# Patient Record
Sex: Male | Born: 1965 | Race: White | Hispanic: No | Marital: Married | Smoking: Never smoker
Health system: Southern US, Community
[De-identification: ages and names within clinical notes are randomized; demographics above are authoritative.]

## PROBLEM LIST (undated history)

## (undated) DIAGNOSIS — E669 Obesity, unspecified: Secondary | ICD-10-CM

## (undated) DIAGNOSIS — Z8639 Personal history of other endocrine, nutritional and metabolic disease: Secondary | ICD-10-CM

## (undated) DIAGNOSIS — S46219A Strain of muscle, fascia and tendon of other parts of biceps, unspecified arm, initial encounter: Secondary | ICD-10-CM

## (undated) DIAGNOSIS — Z8719 Personal history of other diseases of the digestive system: Secondary | ICD-10-CM

## (undated) DIAGNOSIS — Z8679 Personal history of other diseases of the circulatory system: Secondary | ICD-10-CM

## (undated) DIAGNOSIS — R748 Abnormal levels of other serum enzymes: Secondary | ICD-10-CM

## (undated) HISTORY — DX: Personal history of other endocrine, nutritional and metabolic disease: Z86.39

## (undated) HISTORY — DX: Obesity, unspecified: E66.9

## (undated) HISTORY — DX: Abnormal levels of other serum enzymes: R74.8

## (undated) HISTORY — DX: Personal history of other diseases of the digestive system: Z87.19

## (undated) HISTORY — DX: Personal history of other diseases of the circulatory system: Z86.79

## (undated) HISTORY — DX: Strain of muscle, fascia and tendon of other parts of biceps, unspecified arm, initial encounter: S46.219A

## (undated) HISTORY — PX: APPENDECTOMY: SHX54

## (undated) HISTORY — PX: BICEPS TENDON REPAIR: SHX566

---

## 2003-08-25 ENCOUNTER — Ambulatory Visit (HOSPITAL_BASED_OUTPATIENT_CLINIC_OR_DEPARTMENT_OTHER): Admission: RE | Admit: 2003-08-25 | Discharge: 2003-08-25 | Payer: Self-pay | Admitting: *Deleted

## 2004-02-18 ENCOUNTER — Ambulatory Visit (HOSPITAL_COMMUNITY): Admission: RE | Admit: 2004-02-18 | Discharge: 2004-02-18 | Payer: Self-pay | Admitting: *Deleted

## 2004-10-01 ENCOUNTER — Ambulatory Visit: Payer: Self-pay | Admitting: Family Medicine

## 2004-12-31 ENCOUNTER — Ambulatory Visit: Payer: Self-pay | Admitting: Family Medicine

## 2006-08-16 ENCOUNTER — Ambulatory Visit: Payer: Self-pay | Admitting: Family Medicine

## 2006-08-28 ENCOUNTER — Ambulatory Visit: Payer: Self-pay | Admitting: Internal Medicine

## 2006-10-03 ENCOUNTER — Ambulatory Visit: Payer: Self-pay | Admitting: Family Medicine

## 2006-10-03 LAB — CONVERTED CEMR LAB
ALT: 65 units/L — ABNORMAL HIGH (ref 0–40)
AST: 37 units/L (ref 0–37)
BUN: 10 mg/dL (ref 6–23)
Basophils Relative: 0.2 % (ref 0.0–1.0)
CO2: 32 meq/L (ref 19–32)
Calcium: 9.1 mg/dL (ref 8.4–10.5)
Chloride: 106 meq/L (ref 96–112)
Creatinine, Ser: 1.1 mg/dL (ref 0.4–1.5)
Eosinophils Absolute: 0.2 10*3/uL (ref 0.0–0.6)
Eosinophils Relative: 2.4 % (ref 0.0–5.0)
HDL: 40.2 mg/dL (ref >39.0)
Platelets: 205 10*3/uL (ref 150–400)
Potassium: 3.9 meq/L (ref 3.5–5.1)
RBC: 5.2 M/uL (ref 4.22–5.81)
RDW: 13.3 % (ref 11.5–14.6)
TSH: 2.27 microintl units/mL (ref 0.35–5.50)
Total CHOL/HDL Ratio: 5.1
Triglycerides: 96 mg/dL (ref 0–149)
VLDL: 19 mg/dL (ref 0–40)
WBC: 8.7 10*3/uL (ref 4.5–10.5)

## 2006-10-26 ENCOUNTER — Ambulatory Visit: Payer: Self-pay | Admitting: Family Medicine

## 2008-08-25 ENCOUNTER — Ambulatory Visit: Payer: Self-pay | Admitting: Family Medicine

## 2008-10-02 ENCOUNTER — Encounter (INDEPENDENT_AMBULATORY_CARE_PROVIDER_SITE_OTHER): Payer: Self-pay | Admitting: *Deleted

## 2008-10-02 DIAGNOSIS — E785 Hyperlipidemia, unspecified: Secondary | ICD-10-CM | POA: Insufficient documentation

## 2008-10-02 LAB — CONVERTED CEMR LAB: HDL: 40.2 mg/dL

## 2008-10-03 ENCOUNTER — Ambulatory Visit: Payer: Self-pay | Admitting: Family Medicine

## 2008-10-03 LAB — CONVERTED CEMR LAB
Glucose, Urine, Semiquant: NEGATIVE
Protein, U semiquant: NEGATIVE
Specific Gravity, Urine: 1.01
WBC Urine, dipstick: NEGATIVE
pH: 5.5

## 2008-10-06 LAB — CONVERTED CEMR LAB
ALT: 52 units/L (ref 0–53)
Albumin: 4.2 g/dL (ref 3.5–5.2)
Basophils Absolute: 0 10*3/uL (ref 0.0–0.1)
Basophils Relative: 0.2 % (ref 0.0–3.0)
Calcium: 9.1 mg/dL (ref 8.4–10.5)
Cholesterol: 192 mg/dL (ref 0–200)
Creatinine, Ser: 1.1 mg/dL (ref 0.4–1.5)
Eosinophils Absolute: 0.1 10*3/uL (ref 0.0–0.7)
GFR calc non Af Amer: 79 mL/min
HCT: 43.4 % (ref 39.0–52.0)
Hemoglobin: 15.6 g/dL (ref 13.0–17.0)
MCHC: 36 g/dL (ref 30.0–36.0)
MCV: 86.3 fL (ref 78.0–100.0)
Monocytes Absolute: 0.7 10*3/uL (ref 0.1–1.0)
Neutro Abs: 5.7 10*3/uL (ref 1.4–7.7)
PSA: 0.85 ng/mL (ref 0.10–4.00)
RBC: 5.03 M/uL (ref 4.22–5.81)
TSH: 1.93 microintl units/mL (ref 0.35–5.50)
Total Bilirubin: 0.8 mg/dL (ref 0.3–1.2)
VLDL: 22 mg/dL (ref 0–40)

## 2008-12-02 ENCOUNTER — Ambulatory Visit: Payer: Self-pay | Admitting: Family Medicine

## 2009-10-09 ENCOUNTER — Ambulatory Visit: Payer: Self-pay | Admitting: Family Medicine

## 2009-10-09 DIAGNOSIS — I10 Essential (primary) hypertension: Secondary | ICD-10-CM

## 2009-10-12 LAB — CONVERTED CEMR LAB
ALT: 70 units/L — ABNORMAL HIGH (ref 0–53)
Albumin: 4.3 g/dL (ref 3.5–5.2)
Basophils Relative: 0.1 % (ref 0.0–3.0)
Bilirubin, Direct: 0.2 mg/dL (ref 0.0–0.3)
CO2: 32 meq/L (ref 19–32)
Calcium: 8.8 mg/dL (ref 8.4–10.5)
Direct LDL: 161.6 mg/dL
Eosinophils Absolute: 0.1 10*3/uL (ref 0.0–0.7)
Eosinophils Relative: 1.7 % (ref 0.0–5.0)
Glucose, Bld: 101 mg/dL — ABNORMAL HIGH (ref 70–99)
HCT: 47.8 % (ref 39.0–52.0)
HDL: 41.8 mg/dL (ref >39.00)
Lymphs Abs: 1.6 10*3/uL (ref 0.7–4.0)
MCHC: 33.1 g/dL (ref 30.0–36.0)
MCV: 90.6 fL (ref 78.0–100.0)
Monocytes Absolute: 0.5 10*3/uL (ref 0.1–1.0)
Neutro Abs: 3.8 10*3/uL (ref 1.4–7.7)
Neutrophils Relative %: 62.8 % (ref 43.0–77.0)
Potassium: 3.9 meq/L (ref 3.5–5.1)
RBC: 5.28 M/uL (ref 4.22–5.81)
TSH: 1.91 microintl units/mL (ref 0.35–5.50)
Total Protein: 7.1 g/dL (ref 6.0–8.3)
Triglycerides: 57 mg/dL (ref 0.0–149.0)

## 2009-10-21 ENCOUNTER — Encounter: Payer: Self-pay | Admitting: Family Medicine

## 2009-11-10 ENCOUNTER — Ambulatory Visit: Payer: Self-pay | Admitting: Family Medicine

## 2009-11-10 DIAGNOSIS — R740 Nonspecific elevation of levels of transaminase and lactic acid dehydrogenase [LDH]: Secondary | ICD-10-CM

## 2010-02-09 ENCOUNTER — Ambulatory Visit: Payer: Self-pay | Admitting: Family Medicine

## 2010-02-09 LAB — CONVERTED CEMR LAB
ALT: 53 units/L (ref 0–53)
AST: 32 units/L (ref 0–37)
Albumin: 4 g/dL (ref 3.5–5.2)
Alkaline Phosphatase: 37 units/L — ABNORMAL LOW (ref 39–117)
Bilirubin, Direct: 0.1 mg/dL (ref 0.0–0.3)
Cholesterol: 195 mg/dL (ref 0–200)
Total Protein: 6.3 g/dL (ref 6.0–8.3)
Triglycerides: 102 mg/dL (ref 0.0–149.0)

## 2010-02-17 ENCOUNTER — Ambulatory Visit: Payer: Self-pay | Admitting: Family Medicine

## 2010-10-04 ENCOUNTER — Telehealth (INDEPENDENT_AMBULATORY_CARE_PROVIDER_SITE_OTHER): Payer: Self-pay | Admitting: *Deleted

## 2010-10-07 ENCOUNTER — Other Ambulatory Visit (INDEPENDENT_AMBULATORY_CARE_PROVIDER_SITE_OTHER): Payer: BC Managed Care – PPO

## 2010-10-07 ENCOUNTER — Encounter (INDEPENDENT_AMBULATORY_CARE_PROVIDER_SITE_OTHER): Payer: Self-pay | Admitting: *Deleted

## 2010-10-07 ENCOUNTER — Ambulatory Visit: Admit: 2010-10-07 | Payer: Self-pay | Admitting: Family Medicine

## 2010-10-07 ENCOUNTER — Other Ambulatory Visit: Payer: Self-pay | Admitting: Family Medicine

## 2010-10-07 DIAGNOSIS — I10 Essential (primary) hypertension: Secondary | ICD-10-CM

## 2010-10-07 DIAGNOSIS — E785 Hyperlipidemia, unspecified: Secondary | ICD-10-CM

## 2010-10-07 DIAGNOSIS — Z0000 Encounter for general adult medical examination without abnormal findings: Secondary | ICD-10-CM

## 2010-10-07 LAB — CBC WITH DIFFERENTIAL/PLATELET
Basophils Absolute: 0 10*3/uL (ref 0.0–0.1)
Basophils Relative: 0.2 % (ref 0.0–3.0)
HCT: 43.4 % (ref 39.0–52.0)
Hemoglobin: 15.1 g/dL (ref 13.0–17.0)
Lymphocytes Relative: 33.1 % (ref 12.0–46.0)
Lymphs Abs: 2.2 10*3/uL (ref 0.7–4.0)
MCHC: 34.7 g/dL (ref 30.0–36.0)
Monocytes Relative: 9.1 % (ref 3.0–12.0)
Neutro Abs: 3.7 10*3/uL (ref 1.4–7.7)
RBC: 4.81 Mil/uL (ref 4.22–5.81)
RDW: 13.6 % (ref 11.5–14.6)

## 2010-10-07 LAB — BASIC METABOLIC PANEL
CO2: 29 mEq/L (ref 19–32)
Calcium: 8.7 mg/dL (ref 8.4–10.5)
Glucose, Bld: 96 mg/dL (ref 70–99)
Potassium: 4.3 mEq/L (ref 3.5–5.1)
Sodium: 138 mEq/L (ref 135–145)

## 2010-10-07 LAB — HEPATIC FUNCTION PANEL
AST: 32 U/L (ref 0–37)
Albumin: 4 g/dL (ref 3.5–5.2)
Alkaline Phosphatase: 37 U/L — ABNORMAL LOW (ref 39–117)
Total Protein: 6.5 g/dL (ref 6.0–8.3)

## 2010-10-07 LAB — LIPID PANEL: Total CHOL/HDL Ratio: 5

## 2010-10-07 NOTE — Assessment & Plan Note (Signed)
Summary: 9M F/U DLO   Vital Signs:  Patient profile:   45 year old male Height:      68 inches Weight:      260.50 pounds BMI:     39.75 Temp:     97.3 degrees F oral Pulse rate:   84 / minute Pulse rhythm:   regular BP sitting:   126 / 80  (left arm) Cuff size:   large  Vitals Entered By: Linde Gillis CMA Duncan Dull) (February 17, 2010 7:58 AM) CC: 3 months follow up   History of Present Illness: here for f/u of HTN and chol   lipids imp with trig 102, HDL 38, LDL 136 (down from 161)  ast/alt are better  wt is down 4 lb  bp good 126/80  is doing well overall  has cut out some of the fatty foods -- has cut down on eats  is working hard outside -- and this is helping a lot too   arms and shoulders are giving him some problems - hurting  pain has slacked off a bit  hx of bicep rupture does not have the strength he used to     Allergies: No Known Drug Allergies  Past History:  Past Surgical History: Last updated: 10/02/2008 SX- ruptured biceps tendon  Family History: Last updated: 10/03/2008 father HTN, prostate cancer mother - mouth cancer  GM with CAD GF with CAD  Social History: Last updated: 10/03/2008 works in Orthoptist - / driving  married  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 Eyes:  Denies blurring, eye irritation, and eye pain. CV:  Denies chest pain or discomfort, palpitations, shortness of breath with exertion, and swelling of feet. Resp:  Denies cough, shortness of breath, and wheezing. GI:  Denies abdominal pain, change in bowel habits, and indigestion. GU:  Denies urinary frequency. MS:  Complains of joint pain and stiffness; denies joint redness, joint swelling, cramps, and muscle  weakness. Derm:  Denies poor wound healing and rash. Neuro:  Denies headaches, numbness, and tingling. Endo:  Denies excessive thirst and excessive urination.  Physical Exam  General:  overweight but generally well appearing  Head:  normocephalic, atraumatic, and no abnormalities observed.   Eyes:  vision grossly intact, pupils equal, pupils round, and pupils reactive to light.  no conjunctival pallor, injection or icterus  Mouth:  pharynx pink and moist.   Neck:  supple with full rom and no masses or thyromegally, no JVD or carotid bruit   Lungs:  Normal respiratory effort, chest expands symmetrically. Lungs are clear to auscultation, no crackles or wheezes. Heart:  Normal rate and regular rhythm. S1 and S2 normal without gallop, murmur, click, rub or other extra sounds. Msk:  No deformity or scoliosis noted of thoracic or lumbar spine.  no acute joint changes nl rom arms and shoulders today without bony tenderness  Pulses:  R and L carotid,radial,femoral,dorsalis pedis and posterior tibial pulses are full and equal bilaterally Extremities:  No clubbing, cyanosis, edema, or deformity noted with normal full range of motion of all joints.   Neurologic:  sensation intact to light touch, gait normal, and DTRs symmetrical and normal.   Skin:  Intact without suspicious lesions or rashes tanned  Cervical Nodes:  No lymphadenopathy noted  Psych:  normal affect, talkative and pleasant    Impression & Recommendations:  Problem # 1:  ESSENTIAL HYPERTENSION, BENIGN (ICD-401.1) Assessment Improved  this continues to improve with lisinopril and now wt loss no changes  urged to keep up the good work f/u 6 mo  His updated medication list for this problem includes:    Lisinopril 10 Mg Tabs (Lisinopril) .Marland Kitchen... 1 by mouth once daily  BP today: 126/80 Prior BP: 140/86 (11/10/2009)  Labs Reviewed: K+: 3.9 (10/09/2009) Creat: : 1.0 (10/09/2009)   Chol: 195 (02/09/2010)   HDL: 38.30 (02/09/2010)    LDL: 136 (02/09/2010)   TG: 102.0 (02/09/2010)  Problem # 2:  TRANSAMINASES, SERUM, ELEVATED (ICD-790.4) Assessment: Improved these are down to nl with wt loss and better diet  suspect fatty liver  urged to keep up better habits  re check 6 mo   Problem # 3:  HYPERLIPIDEMIA (ICD-272.4) Assessment: Improved  this is improved with smaller portions and more exercise disc lower sat fat diet in detail - still could do better with food choices would like to see LDL under 130- or better, under 100 plan to re check in 6 mo and f/u   Labs Reviewed: SGOT: 32 (02/09/2010)   SGPT: 53 (02/09/2010)   HDL:38.30 (02/09/2010), 41.80 (10/09/2009)  LDL:136 (02/09/2010), 135 (10/03/2008)  Chol:195 (02/09/2010), 210 (10/09/2009)  Trig:102.0 (02/09/2010), 57.0 (10/09/2009)  Problem # 4:  ARM PAIN (ICD-729.5) Assessment: New this is intermittent in pt with hx of bilat bicep ruptures in past  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 Take 1 tablet by mouth once a day 2)  Lisinopril 10 Mg Tabs (Lisinopril) .Marland Kitchen.. 1 by mouth once daily  Patient Instructions: 1)  keep working on smaller portions 2)  in addition - avoid as much saturated fat as possible (you can raise your HDL (good cholesterol) by increasing exercise and eating omega 3 fatty acid supplement like fish oil or flax seed oil over the counter 3)  you can lower LDL (bad cholesterol) by limiting saturated fats in diet like red meat, fried foods, egg yolks, fatty breakfast meats, high fat dairy products and shellfish )-- especially fried foods 4)  keep up exercise  5)  no change in medicine  6)  bp and cholesterol are better  7)  schedule fasting labs and then follow up in 6 months -- lipid/ast/alt/renal 272, 401.1  8)  if your shoulder and arm pain return- call us to make appt with Dr  Dallas Schimke ( sports med specialist)   Current Allergies (reviewed today): No known allergies

## 2010-10-07 NOTE — Assessment & Plan Note (Signed)
Summary: CPX FOR DOT PER LAURIE/DLO   Vital Signs:  Patient profile:   45 year old male Height:      68 inches Weight:      262 pounds BMI:     39.98 Temp:     98.4 degrees F oral Pulse rate:   72 / minute Pulse rhythm:   regular BP supine:   140 / 95  Vitals Entered By: Lowella Petties CMA (October 09, 2009 8:12 AM) CC: Check up with form  Vision Screening:Left eye w/o correction: 20 / 20 Right Eye w/o correction: 20 / 50 Both eyes w/o correction:  20/ 20        Vision Entered By: Lowella Petties CMA (October 09, 2009 8:13 AM)  Hearing Screen  20db HL: Left  500 hz: 25db 1000 hz: 25db 2000 hz: 25db 4000 hz: 25db Right  500 hz: 25db 1000 hz: 25db 2000 hz: 25db 4000 hz: 25db   Hearing Testing Entered By: Lowella Petties CMA (October 09, 2009 8:13 AM)   History of Present Illness: here for DOT physical  bp- has been ok at home 120-130s / 80s   one day last week - bp was pretty high and he felt lousy  vision was blurry and very slt headache feels much better now   has tendancy to fall asleep in recliner at night  does snore but no fatigue - feels rested overall   lipids last check LDL in 130s  nl hearing   vison nl both eyes but 20/50 in the R eye  pt states he is always "weak "in his R eye - this is nl for him uses reading glasses     Allergies: No Known Drug Allergies  Past History:  Past Medical History: Last updated: 10/03/2008 GERD Hyperlipidemia obesity hx of bicep tendon rupture bilaterally  Past Surgical History: Last updated: 10/02/2008 SX- ruptured biceps tendon  Family History: Last updated: 10/03/2008 father HTN, prostate cancer mother - mouth cancer  GM with CAD GF with CAD  Social History: Last updated: 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 Eyes:  Denies blurring and eye pain. CV:  Denies chest pain or discomfort, lightheadness, and palpitations. Resp:  Denies cough and wheezing. GI:  Denies abdominal pain, bloody stools, change in bowel habits, indigestion, and nausea. GU:  Denies nocturia, urinary frequency, and urinary hesitancy. MS:  Denies joint pain, muscle weakness, and stiffness. Derm:  Denies itching, lesion(s), poor wound healing, and rash. Neuro:  Denies numbness and tingling. Psych:  Denies anxiety and depression. Endo:  Denies excessive thirst and excessive urination. Heme:  Denies abnormal bruising and bleeding.  Physical Exam  General:  overweight but generally well appearing  Head:  normocephalic, atraumatic, and no abnormalities observed.   Eyes:  vision grossly intact, pupils equal, pupils round, and pupils reactive to light.  no conjunctival pallor, injection or icterus  Ears:  R ear normal and L ear normal.  - scant cerumen Nose:  no nasal discharge.   Mouth:  pharynx pink and moist.   Neck:  supple with full rom and no masses or thyromegally, no JVD or carotid bruit   Chest Wall:  No deformities, masses, tenderness or gynecomastia noted. Lungs:  Normal respiratory effort, chest expands symmetrically. Lungs are clear  to auscultation, no crackles or wheezes. Heart:  Normal rate and regular rhythm. S1 and S2 normal without gallop, murmur, click, rub or other extra sounds. Abdomen:  Bowel sounds positive,abdomen soft and non-tender without masses, organomegaly or hernias noted. no renal bruits Rectal:  No external abnormalities noted. Normal sphincter tone. No rectal masses or tenderness. Prostate:  Prostate gland firm and smooth, no enlargement, nodularity, tenderness, mass, asymmetry or induration. Msk:  No deformity or scoliosis noted of thoracic or lumbar spine.  no acute joint changes  Pulses:  R and L carotid,radial,femoral,dorsalis pedis and posterior tibial pulses are full and equal  bilaterally Extremities:  No clubbing, cyanosis, edema, or deformity noted with normal full range of motion of all joints.   Neurologic:  sensation intact to light touch, gait normal, and DTRs symmetrical and normal.   Skin:  Intact without suspicious lesions or rashes ruddy complexion stable brown nevi on back and some lentigos Cervical Nodes:  No lymphadenopathy noted Inguinal Nodes:  No significant adenopathy Psych:  normal affect, talkative and pleasant    Impression & Recommendations:  Problem # 1:  OTH GENERAL MEDICAL EXAMINATION ADMIN PURPOSES (ICD-V70.3) Assessment Comment Only DOT- approved for 1 year in light of HTN  no restrictions  reviewed health habits including diet, exercise and skin cancer prevention reviewed health maintenance list and family history  Problem # 2:  ESSENTIAL HYPERTENSION, BENIGN (ICD-401.1) Assessment: Unchanged new dx  start lisinopril handout on lifestyle change aafp- disc this in detail- esp low sodium diet  His updated medication list for this problem includes:    Lisinopril 10 Mg Tabs (Lisinopril) .Marland Kitchen... 1 by mouth once daily  Orders: Venipuncture (78295) TLB-Lipid Panel (80061-LIPID) TLB-Renal Function Panel (80069-RENAL) TLB-CBC Platelet - w/Differential (85025-CBCD) TLB-Hepatic/Liver Function Pnl (80076-HEPATIC) TLB-TSH (Thyroid Stimulating Hormone) (84443-TSH)  Problem # 3:  HYPERLIPIDEMIA (ICD-272.4) Assessment: Unchanged  lab today fair diet  disc at f/u  Orders: Venipuncture (62130) TLB-Lipid Panel (80061-LIPID) TLB-Renal Function Panel (80069-RENAL) TLB-CBC Platelet - w/Differential (85025-CBCD) TLB-Hepatic/Liver Function Pnl (80076-HEPATIC) TLB-TSH (Thyroid Stimulating Hormone) (84443-TSH)  Labs Reviewed: SGOT: 34 (10/03/2008)   SGPT: 52 (10/03/2008)   HDL:35.6 (10/03/2008), 40.2 (10/02/2008)  LDL:135 (10/03/2008), 148 (10/02/2008)  Chol:192 (10/03/2008), 204 (10/02/2008)  Trig:109 (10/03/2008), 96  (10/03/2006)  Complete Medication List: 1)  Prilosec 20 Mg Cpdr (Omeprazole) .... Take 1 tablet by mouth once a day 2)  Lisinopril 10 Mg Tabs (Lisinopril) .Marland Kitchen.. 1 by mouth once daily  Other Orders: Ophthalmology Referral (Ophthalmology)  Patient Instructions: 1)  start lisinopril one pill daily  2)  work on low salt diet  3)  if any side effects please update me  4)  we will do eye doctor referral at check out  5)  follow up with me in about 1 month  Prescriptions: LISINOPRIL 10 MG TABS (LISINOPRIL) 1 by mouth once daily  #30 x 11   Entered and Authorized by:   Judith Part MD   Signed by:   Judith Part MD on 10/09/2009   Method used:   Electronically to        [facility]  [place] (retail)       [address]       [place], [place]  [address]       Ph: [phone] or [phone]       Fax: [phone]   RxID:   [id number]

## 2010-10-07 NOTE — Consult Note (Signed)
Summary: [facility]   Imported By: Lanelle Bal 11/02/2009 13:45:01  _____________________________________________________________________  External Attachment:    Type:   Image     Comment:   External Document

## 2010-10-07 NOTE — Letter (Signed)
Summary: CDL Form  CDL Form   Imported By: Beau Fanny 10/09/2009 10:59:00  _____________________________________________________________________  External Attachment:    Type:   Image     Comment:   External Document

## 2010-10-07 NOTE — Assessment & Plan Note (Signed)
Summary: 3 M F/U DLO   Vital Signs:  Patient profile:   45 year old male Height:      68 inches Weight:      264.75 pounds BMI:     40.40 Temp:     98.6 degrees F oral Pulse rate:   72 / minute Pulse rhythm:   regular BP sitting:   140 / 86  (left arm) Cuff size:   large  Vitals Entered By: Lewanda Rife LPN (November 10, 9145 8:04 AM)  Serial Vital Signs/Assessments:  Time      Position  BP       Pulse  Resp  Temp     By                     125/85                         Judith Part MD   History of Present Illness: here for f/u of HTN and lipids and elevated ast/alt   wt is up 2 lb  bp first check 140/86 which is improved is tolerating his bp med fine without problems  1-2 days later 120s/70s  yest am checked it - back up a little  no dizziness or low bp   lipids were high at check 1 mo ago with trig 57/ HDL 41 and LDL 161 (up from 130s) has tried to cut back a bit  eats steak 2-3 times per week  some eggs and egg salad occas- not often  some fried foods pretty often   hard to find other foods to eat  does not want more med   ast /alt 41 and 70 - tend to go up and down      Allergies (verified): No Known Drug Allergies  Past History:  Past Surgical History: Last updated: 10/02/2008 SX- ruptured biceps tendon  Family History: Last updated: 10/03/2008 father HTN, prostate cancer mother - mouth cancer  GM with CAD GF with CAD  Social History: 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 Eyes:  Denies blurring and eye irritation. CV:  Denies chest pain or discomfort, lightheadness, and palpitations. Resp:  Denies cough, shortness of breath, and  wheezing. GI:  Denies change in bowel habits and nausea. GU:  Denies urinary frequency. MS:  Denies joint pain, joint redness, joint swelling, and muscle aches. Derm:  Denies itching, lesion(s), poor wound healing, and rash. Neuro:  Denies numbness and tingling. Psych:  Denies anxiety and depression. Endo:  Denies cold intolerance, excessive thirst, excessive urination, and heat intolerance. Heme:  Denies abnormal bruising and bleeding.  Physical Exam  General:  overweight but generally well appearing  Head:  normocephalic, atraumatic, and no abnormalities observed.   Mouth:  pharynx pink and moist.   Neck:  supple with full rom and no masses or thyromegally, no JVD or carotid bruit   Lungs:  Normal respiratory effort, chest expands symmetrically. Lungs are clear to auscultation, no crackles or wheezes. Heart:  Normal rate and regular rhythm. S1 and S2 normal without gallop, murmur, click, rub or other extra sounds. Abdomen:  Bowel sounds positive,abdomen soft  and non-tender without masses, organomegaly or hernias noted. no renal bruits Msk:  No deformity or scoliosis noted of thoracic or lumbar spine.  no acute joint changes  Extremities:  No clubbing, cyanosis, edema, or deformity noted with normal full range of motion of all joints.   Neurologic:  sensation intact to light touch, gait normal, and DTRs symmetrical and normal.   Skin:  Intact without suspicious lesions or rashes Cervical Nodes:  No lymphadenopathy noted Psych:  normal affect, talkative and pleasant    Impression & Recommendations:  Problem # 1:  ESSENTIAL HYPERTENSION, BENIGN (ICD-401.1) Assessment Improved  improved with lisinopril disc healthy diet (low simple sugar/ choose complex carbs/ low sat fat) diet and exercise in detail  did adv to continue monitoring at home  will be more active this spring work on wt loss lab and f/u in 3 mo  His updated medication list for this problem includes:    Lisinopril  10 Mg Tabs (Lisinopril) .Marland Kitchen... 1 by mouth once daily  BP today: 140/86-- re check 122/ 85 at rest with large cuff Prior BP: 140/95 (10/09/2009)  Labs Reviewed: K+: 3.9 (10/09/2009) Creat: : 1.0 (10/09/2009)   Chol: 210 (10/09/2009)   HDL: 41.80 (10/09/2009)   LDL: 135 (10/03/2008)   TG: 57.0 (10/09/2009)  Problem # 2:  HYPERLIPIDEMIA (ICD-272.4) Assessment: Deteriorated  disc this in detail- pt eats very high sat fat diet this has been lower in past with better diet  rev sat fat in detail - pt has handout  rev labs with pt in detail today and goals for LDL and HDL  sched fasting lab and f/u in 79mo   Labs Reviewed: SGOT: 41 (10/09/2009)   SGPT: 70 (10/09/2009)   HDL:41.80 (10/09/2009), 35.6 (10/03/2008)  LDL:135 (10/03/2008), 148 (10/02/2008)  Chol:210 (10/09/2009), 192 (10/03/2008)  Trig:57.0 (10/09/2009), 109 (10/03/2008)  Problem # 3:  TRANSAMINASES, SERUM, ELEVATED (ICD-790.4) Assessment: Deteriorated this is intermittent and likely due to fatty liver/ poor diet  disc this in detail no hx of hepatitis or liver dz and no symptoms  will re check with wt loss and better diet and f/u 3 mo  Problem # 4:  TRANSAMINASES, SERUM, ELEVATED (ICD-790.4)  Complete Medication List: 1)  Prilosec 20 Mg Cpdr (Omeprazole) .... Take 1 tablet by mouth once a day 2)  Lisinopril 10 Mg Tabs (Lisinopril) .Marland Kitchen.. 1 by mouth once daily  Patient Instructions: 1)  you can raise your HDL (good cholesterol) by increasing exercise and eating omega 3 fatty acid supplement like fish oil or flax seed oil over the counter 2)  you can lower LDL (bad cholesterol) by limiting saturated fats in diet like red meat, fried foods, egg yolks, fatty breakfast meats, high fat dairy products and shellfish  3)  schedule fasting labs in 3 months and then follow up  4)  lipid/hepatic fxn for 272 and 790.4  Current Allergies (reviewed today): No known allergies

## 2010-10-13 ENCOUNTER — Encounter: Payer: Self-pay | Admitting: Family Medicine

## 2010-10-13 ENCOUNTER — Encounter (INDEPENDENT_AMBULATORY_CARE_PROVIDER_SITE_OTHER): Payer: BC Managed Care – PPO | Admitting: Family Medicine

## 2010-10-13 DIAGNOSIS — D485 Neoplasm of uncertain behavior of skin: Secondary | ICD-10-CM

## 2010-10-13 DIAGNOSIS — E785 Hyperlipidemia, unspecified: Secondary | ICD-10-CM

## 2010-10-13 DIAGNOSIS — Z0289 Encounter for other administrative examinations: Secondary | ICD-10-CM

## 2010-10-13 DIAGNOSIS — Z0000 Encounter for general adult medical examination without abnormal findings: Secondary | ICD-10-CM

## 2010-10-13 DIAGNOSIS — I10 Essential (primary) hypertension: Secondary | ICD-10-CM

## 2010-10-13 DIAGNOSIS — Z8042 Family history of malignant neoplasm of prostate: Secondary | ICD-10-CM

## 2010-10-13 LAB — CONVERTED CEMR LAB
Bacteria, UA: 0
Bilirubin Urine: NEGATIVE
Glucose, Urine, Semiquant: NEGATIVE
Protein, U semiquant: NEGATIVE
Specific Gravity, Urine: 1.01
WBC, UA: 0 cells/hpf
Yeast, UA: 0
pH: 8

## 2010-10-13 NOTE — Progress Notes (Signed)
----   Converted from flag ---- ---- 10/02/2010 2:44 PM, Colon Flattery Tower MD wrote: please check wellness/ lipid for v70.0 and 272 and 401.1 thanks  ---- 10/01/2010 10:03 AM, Liane Comber CMA (AAMA) wrote: Lab orders please! Good Morning! This pt is scheduled for cpx labs [place], which labs to draw and dx codes to use? Thanks Tasha ------------------------------

## 2010-10-27 NOTE — Assessment & Plan Note (Signed)
Summary: CPX/DLO   Vital Signs:  Patient profile:   45 year old male Height:      67.5 inches Weight:      263.50 pounds BMI:     40.81 Temp:     98.2 degrees F oral Pulse rate:   80 / minute Pulse rhythm:   regular BP sitting:   126 / 78  (left arm) Cuff size:   large  Vitals Entered By: Lewanda Rife LPN (October 13, 2010 9:47 AM) CC: CPX and DOT exam  Vision Screening:Left eye w/o correction: 20 / 20 Right Eye w/o correction: 20 / 40 Both eyes w/o correction:  20/ 20        Vision Entered By: Lewanda Rife LPN (October 13, 2010 9:48 AM)  Hearing Screen 25db HL: Left  500 hz: 25db 1000 hz: 25db 2000 hz: 25db 4000 hz: 25db Right  500 hz: 25db 1000 hz: 25db 2000 hz: 25db 4000 hz: 25db    History of Present Illness: here for DOT examination and to review chronic med problems  is doing ok - overall nothing new  no injuries or bicep problems  had good holidays   wt is stable with bmi of 40 (though is also muscular build)  bp is well controlled 126/78- doing well with that on lisinoprol  prilosec for GERD works well   chol up a little with LDL 149 from 136 diet - not much of an effort -- but tries not to eat as much fried food  chol tends to go down with working in summer   TD 07 does not get flu shots   father hx prostate cancer his psa nl in 2010 baseline  no urinary problems at all , no nocturia       Allergies (verified): No Known Drug Allergies  Past History:  Past Medical History: Last updated: 02/17/2010 GERD Hyperlipidemia obesity hx of bicep tendon rupture bilaterally HTN elevated transaminases (likely fatty liver)  Past Surgical History: Last updated: 10/02/2008 SX- ruptured biceps tendon  Family History: Last updated: 10/03/2008 father HTN, prostate cancer mother - mouth cancer  GM with CAD GF with CAD  Social History: Last updated: 10/03/2008 works in Orthoptist - / driving  married  has children  non smoker  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 Eyes:  Denies blurring and eye irritation. CV:  Denies chest pain or discomfort, lightheadness, palpitations, and shortness of breath with exertion. Resp:  Denies cough, shortness of breath, and wheezing. GI:  Denies abdominal pain, bloody stools, change in bowel habits, indigestion, and nausea. GU:  Denies dysuria, nocturia, urinary frequency, and urinary hesitancy. MS:  Denies joint pain, joint redness, and joint swelling. Derm:  Denies itching, lesion(s), poor wound healing, and rash. Neuro:  Denies headaches, numbness, and tingling. Psych:  Denies anxiety and depression. Endo:  Denies excessive thirst and excessive urination. Heme:  Denies abnormal bruising and bleeding.  Physical Exam  General:  overweight but generally well appearing  Head:  normocephalic, atraumatic, and no abnormalities observed.   Eyes:  vision grossly intact, pupils equal, pupils round, and pupils reactive to light.  no conjunctival pallor, injection or icterus  Ears:  R ear normal and L ear normal.  - scant cerumen Nose:  no nasal discharge.   Mouth:  pharynx pink and moist.   Neck:  supple with full rom and  no masses or thyromegally, no JVD or carotid bruit   Chest Wall:  No deformities, masses, tenderness or gynecomastia noted. Lungs:  Normal respiratory effort, chest expands symmetrically. Lungs are clear to auscultation, no crackles or wheezes. Heart:  Normal rate and regular rhythm. S1 and S2 normal without gallop, murmur, click, rub or other extra sounds. Abdomen:  Bowel sounds positive,abdomen soft and non-tender without masses, organomegaly or hernias noted. no renal bruits Rectal:  No external abnormalities noted. Normal sphincter tone. No rectal masses or tenderness. Prostate:  Prostate gland firm and smooth, no enlargement, nodularity, tenderness, mass, asymmetry  or induration. Msk:  No deformity or scoliosis noted of thoracic or lumbar spine.  no acute joint changes nl rom arms and shoulders today without bony tenderness  Pulses:  R and L carotid,radial,femoral,dorsalis pedis and posterior tibial pulses are full and equal bilaterally Extremities:  No clubbing, cyanosis, edema, or deformity noted with normal full range of motion of all joints.   Neurologic:  sensation intact to light touch, gait normal, and DTRs symmetrical and normal.   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 1 by mouth once daily  BP today: 126/78 Prior BP: 126/80 (02/17/2010)  Labs Reviewed: K+: 4.3 (10/07/2010) Creat: : 1.0 (10/07/2010)   Chol: 200 (10/07/2010)   HDL: 38.30 (10/07/2010)   LDL: 149 (10/07/2010)   TG: 64.0 (10/07/2010)  Orders: UA Dipstick W/ Micro (manual) (65784) Vision Screening (69629)  Problem # 4:  HYPERLIPIDEMIA (ICD-272.4) Assessment: Deteriorated  this is up  long disc re: low sat fat diet- pt  willing to make changes to avoid med re check 6 mo   Labs Reviewed: SGOT: 32 (10/07/2010)   SGPT: 57 (10/07/2010)   HDL:38.30 (10/07/2010), 38.30 (02/09/2010)  LDL:149 (10/07/2010), 136 (02/09/2010)  Chol:200 (10/07/2010), 195 (02/09/2010)  Trig:64.0 (10/07/2010), 102.0 (02/09/2010)  Problem # 5:  TRANSAMINASES, SERUM, ELEVATED (ICD-790.4) Assessment: Unchanged mild and overall stable suspect fatty liver- wt loss would help  Problem # 6:  NEOPLASM, MALIGNANT, PROSTATE, FAMILY HX (ICD-V16.42) Assessment: Unchanged nl exam - no symptoms  continue yearly DREs  Complete Medication List: 1)  Prilosec 20 Mg Cpdr (Omeprazole) .... Take 1 tablet by mouth once a day 2)  Lisinopril 10 Mg Tabs (Lisinopril) .Marland Kitchen.. 1 by mouth once daily 3)  Ibuprofen 200 Mg Tabs (Ibuprofen) .... Otc as directed.  Other Orders: Audiometry [phone])  Patient Instructions: 1)  you can raise your HDL (good cholesterol) by increasing exercise and eating omega 3 fatty acid supplement like fish oil or flax seed oil over the counter 2)  you can lower LDL (bad  cholesterol) by limiting saturated fats in diet like red meat, fried foods, egg yolks, fatty breakfast meats, high fat dairy products and shellfish  3)  we will do referral to dermatology at check out  4)  schedule fasting labs in 6 months for lipid/ast/alt 272 for high cholesterol  Prescriptions: LISINOPRIL 10 MG TABS (LISINOPRIL) 1 by mouth once daily  #30 x 11   Entered and Authorized by:   Judith Part MD   Signed by:   Judith Part MD on 10/13/2010   Method used:   Electronically to        [facility] [id number] (retail[facility]       [address]       [place], [place]  [address]       Ph: [phone] or [phone]       Fax: [phone]   RxID:   [id number]    Orders Added: 1)  UA Dipstick W/ Micro (manual) [81000] 2)  Audiometry [92552] 3)  Vision Screening [36644] 4)  Dermatology Referral [Derma] 5)  Est. Patient 40-64 years  [99396] 6)  Est. Patient Level II [03474]    Current Allergies (reviewed today): No known allergies   Laboratory Results   Urine Tests  Date/Time Received: October 13, 2010 9:49 AM  Date/Time Reported: October 13, 2010 9:49 AM   Routine Urinalysis   Color: yellow Appearance: Clear Glucose: negative   (Normal Range: Negative) Bilirubin: negative   (Normal Range: Negative) Ketone: negative   (Normal Range: Negative) Spec. Gravity: 1.010   (Normal Range: 1.003-1.035) Blood: negative   (Normal Range: Negative) pH: 8.0   (Normal Range: 5.0-8.0) Protein: negative   (Normal Range: Negative) Urobilinogen: 0.2   (Normal Range: 0-1) Nitrite: negative   (Normal Range: Negative) Leukocyte Esterace: negative   (Normal Range: Negative)  Urine Microscopic WBC/HPF: 0 RBC/HPF: 0 Bacteria/HPF: 0 Mucous/HPF: 0 Epithelial/HPF: 0-2 Crystals/HPF: 0 Casts/LPF: 0 Yeast/HPF: 0 Other: 0

## 2010-10-27 NOTE — Letter (Signed)
Summary: CDL Form  CDL Form   Imported By: Lanelle Bal 10/21/2010 10:40:14  _____________________________________________________________________  External Attachment:    Type:   Image     Comment:   External Document

## 2010-12-05 ENCOUNTER — Other Ambulatory Visit: Payer: Self-pay | Admitting: Family Medicine

## 2010-12-06 ENCOUNTER — Encounter: Payer: Self-pay | Admitting: Family Medicine

## 2010-12-24 ENCOUNTER — Encounter: Payer: Self-pay | Admitting: Family Medicine

## 2010-12-24 ENCOUNTER — Ambulatory Visit (INDEPENDENT_AMBULATORY_CARE_PROVIDER_SITE_OTHER): Payer: BC Managed Care – PPO | Admitting: Family Medicine

## 2010-12-24 VITALS — BP 122/80 | HR 76 | Temp 98.0°F | Ht 68.0 in | Wt 265.0 lb

## 2010-12-24 DIAGNOSIS — S0191XA Laceration without foreign body of unspecified part of head, initial encounter: Secondary | ICD-10-CM

## 2010-12-24 DIAGNOSIS — S0190XA Unspecified open wound of unspecified part of head, initial encounter: Secondary | ICD-10-CM

## 2010-12-24 NOTE — Patient Instructions (Signed)
Tylenol is ok for pain as needed  You can use a clean cold compress as needed If wound looks more red/ swollen or any pus- alert me Let the steri strips wear off by themselves  Antibiotic ointment like triple antibiotic is ok to prevent infection  If wound opens up or bleeds a lot - call  If increasing headache/ dizziness/ nausea/ vision change or personality change - call asap

## 2010-12-24 NOTE — Assessment & Plan Note (Signed)
superfical laceration of head after blunt trauma  Bleeding stopped and wound edges clean and well approx  Steri strips applied- 6 and pt tol well Wound care discussed  Adv to update if signs of infx or any symptoms of closed head inj that we rev in detail

## 2010-12-24 NOTE — Progress Notes (Signed)
  Subjective:    Patient ID: Clinton Fowler, male    DOB: 11-18-1965, 45 y.o.   MRN: [id number]  HPI Today was under a counter -- and came up and banged top of his head at 11:30 am Bled like crazy -- took 30 min to get it stopped  No dizziness or headache -- head is sore  Vision is fine   Cleaned the wound with hydrogen peroxide  No abx ointment Bleeding is stopped now  Wound is not gaping open  Prefers no stitches  Feels fine Bleeding is stopped   Td is up to date 2010      Review of Systems Review of Systems  Constitutional: Negative for fever, appetite change, fatigue and unexpected weight change.  Eyes: Negative for pain and visual disturbance.  Respiratory: Negative for cough and shortness of breath.   Cardiovascular: Negative.   Gastrointestinal: Negative for nausea, diarrhea and constipation.  Genitourinary: Negative for urgency and frequency.  Skin: Negative for pallor.  Neurological: Negative for weakness, light-headedness, numbness and headaches.  No dizziness  Hematological: Negative for adenopathy. Does not bruise/bleed easily.  Psychiatric/Behavioral: Negative for dysphoric mood. The patient is not nervous/anxious.         Objective:   Physical Exam  Constitutional: He is oriented to person, place, and time. He appears well-developed and well-nourished.       overwt and well appearing   HENT:  Head: Normocephalic.  Eyes: Conjunctivae and EOM are normal. Pupils are equal, round, and reactive to light.  Neck: Normal range of motion. Neck supple.  Neurological: He is alert and oriented to person, place, and time. He has normal reflexes. No cranial nerve deficit.  Skin: Skin is warm and dry. No pallor.       2-3 cm simple laceration on top of head to R (little hair in area) Wound edges well approx  slt ooze at top of wound where there is mild abrasion  Area cleaned in sterile fashion and 6 steri strips applied with benzosyn tincture  Pt tolerated well     Psychiatric: He has a normal mood and affect.          Assessment & Plan:

## 2010-12-27 ENCOUNTER — Encounter: Payer: Self-pay | Admitting: Family Medicine

## 2011-01-21 NOTE — Op Note (Signed)
NAME:  FELTON, BUCZYNSKI                         ACCOUNT NO.:  [id number]   MEDICAL RECORD NO.:  [id number]                   PATIENT TYPE:  AMB   LOCATION:  DSC                                  FACILITY:  [facility]   PHYSICIAN:  Lowell Bouton, M.D.      DATE OF BIRTH:  01/03/1966   DATE OF PROCEDURE:  08/25/2003  DATE OF DISCHARGE:                                 OPERATIVE REPORT   PREOPERATIVE DIAGNOSIS:  Ruptured biceps tendon, right forearm.   POSTOPERATIVE DIAGNOSIS:  Ruptured biceps tendon, right forearm.   PROCEDURE:  Repair of biceps tendon, right forearm.   SURGEON:  Lowell Bouton, M.D.   ANESTHESIA:  General.   FINDINGS:  The patient had about 5.0 cm of retraction of the tendon.  It was  fairly fibrotic beneath the lacertus fibrosis.   DESCRIPTION OF PROCEDURE:  Under general anesthesia, with the tourniquet  high on the right arm, the right arm was prepped and draped in the usual  fashion.  After exsanguinating the limb, the tourniquet was inflated to 250  mmHg.  An oblique incision was made just ulnar to the mobile wad volarly,  and extended up to the elbow crease.  It was then extended across the elbow  crease and proximally in a zigzag fashion across the elbow crease.  Sharp  dissection was carried through the subcutaneous tissues and bleeding points  were coagulated.  Blunt dissection was carried down through the subcutaneous  tissues, and the lateral cutaneous nerve of the forearm was identified.  It  was protected.  The dissection was then carried down to the cephalic vein  which the radial branch was tied off.  A #4-0 chromic suture was used to tie  the vein off.  The ulnar branch was retracted ulnarly.  The dissection was  then carried down between the biceps and the brachial radialis, and the  radial nerve was identified in that interval.  It was traced from proximal  to distal, taking care to protect the sensory portion and the deep portion.  Multiple crossing vessels were cauterized with bipolar cautery.  After  dissecting the nerve out, the nerve was gently retracted radially.  The  forearm was supinated and the biceps incertus fibrosis was opened.  The  tendon was identified and had retracted about 5.0 cm proximal to the  tuberosity.  The tendon was freed up bluntly and a #2 fiber wire was placed  in the end of the tendon after trimming the tendon, and down to about 8.0  mm.  The plan was to use an interference screw that was bio-absorbable, and  the tendon had to fit into the hole of the screw.  After preparing the  tendon end, the dissection was then carried down to the bicipital  tuberosity.  This was done with the forearm supinated, and one strand  of  the biceps tendon was still intact, so it was traced to the insertion.  The  area was then prepared and deep retractors were inserted, to see the area of  the bicipital tuberosity.  A guide wire was inserted, and then the large  drill for the interference screw was inserted over the guide wire.  Unfortunately it cut out ulnarly, and so when attempting to put in the  interference screw it did not hold.  At this point, the tourniquet had been  up two hours and so it was released.  The wound was irrigated with saline  and we prepared to use a suture anchor for the tendon.  A bio-absorbable #5  anchor was then placed just radial to the original hole in the tuberosity by  first drilling and then tapping the bio-absorbable anchor into the bone.  This was attached with #2 fiber wire, and so the fiber wire was then placed  through the tendon in a modified Kessler whip stitch fashion.  The wound was  then irrigated copiously, so any bone chips could be removed and suctioned  away.  The elbow was flexed, and the tourniquet was reinflated after 10  minutes.  The biceps tendon was then tied down to the anchor on the proximal  radius after preparing the area and roughening it up with a  drill.  The  fiber wire was cut and the second fiber wire that had been in the tendon was  left in the tendon.  The wound was then copiously irrigated, and was closed  over a Vesi-loop drain with #4-0 Vicryl in the subcutaneous tissues, and a  #4-0 running nylon in the skin.  Sterile dressings were applied, followed by  a posterior elbow splint with the elbow fully flexed.  Marcaine 0.5% was  inserted in the skin edges for pain control.  The tourniquet was released,  with good circulation of the hand.  The patient went to the recovery room awake, stable, and in good condition.                                               Lowell Bouton, M.D.    EMM/MEDQ  D:  08/25/2003  T:  08/26/2003  Job:  [id number]   cc:   Marne A. Milinda Antis, M.D. [facility]

## 2011-01-21 NOTE — Op Note (Signed)
NAME:  Clinton Fowler, Clinton Fowler                         ACCOUNT NO.:  [id number]   MEDICAL RECORD NO.:  [id number]                   PATIENT TYPE:  OIB   LOCATION:  2890                                 FACILITY:  [facility]   PHYSICIAN:  Lowell Bouton, M.D.      DATE OF BIRTH:  04-21-1966   DATE OF PROCEDURE:  02/18/2004  DATE OF DISCHARGE:                                 OPERATIVE REPORT   PREOPERATIVE DIAGNOSIS:  Ruptured distal biceps tendon, left forearm.   POSTOPERATIVE DIAGNOSIS:  Ruptured distal biceps tendon, left forearm.   OPERATION PERFORMED:  Repair of ruptured distal biceps tendon, left forearm.   SURGEON:  Lowell Bouton, M.D.   ANESTHESIA:  Axillary block.   OPERATIVE FINDINGS:  The patient had a complete rupture of the biceps tendon  from the radius.  The tendon had retracted about 7 cm proximally.   DESCRIPTION OF PROCEDURE:  Under axillary block anesthesia with a tourniquet  on the left arm, the left arm was prepped and draped in the usual sterile  fashion.  After exsanguinating the limb, the tourniquet was inflated to 250  mmHg.  An oblique incision was made in the forearm and then zigzagged across  the elbow.  Sharp dissection was carried through the subcutaneous tissues  and bleeding points were coagulated.  Blunt dissection was carried down to  the muscle fascia and the interval between the brachioradialis and the  brachialis was identified.  The lacertus fibrosus was identified and opened  and a ruptured tendon was found with fluid surrounding it.  The dissection  was then carried distally between the brachioradialis and the brachialis  down to the pronator teres.  Care was taken to protect the lateral  antebrachial cutaneous nerve and also the median nerve.  The dissection was  then carried down to the radius after identifying the radial nerve and  tracing it from proximal to distal.  The forearm was supinated to protect  the posterior interosseous  nerve and the area of attachment to the radius  was identified by tracing the lacertus down to the radius.  The multiple  veins were ligated and tied off with a 4-0 chromic suture.  The vessels over  the leash of Sherilyn Cooter were also tied off with a 4-0 chromic.  The forearm was  supinated and the area of the radial tuberosity was identified where the  tear had occurred.  This was freed up with a Glorious Peach and a drill bit was used  to drill a hole in the point of insertion for the biceps.  A biodegradable  anchor #5 was then inserted in the hole after tapping the hole and then #2  FiberWire times two was braided in a Kessler fashion through the stump of  the biceps.  The biceps was then cinched down onto the radius with the elbow  flexed.  The wound was then copiously irrigated and a vessel loop drain was  left in for  drainage.  Subcutaneous tissue was closed with a 4-0 Vicryl and  the skin with a 4-0 running nylon.  Sterile dressings were applied followed  by a posterior elbow splint.  The patient tolerated the procedure well and  went to the recovery room awake and stable in  good condition.                                               Lowell Bouton, M.D.    EMM/MEDQ  D:  02/18/2004  T:  02/18/2004  Job:  [id number]

## 2011-04-18 ENCOUNTER — Other Ambulatory Visit (INDEPENDENT_AMBULATORY_CARE_PROVIDER_SITE_OTHER): Payer: BC Managed Care – PPO | Admitting: Family Medicine

## 2011-04-18 DIAGNOSIS — E7800 Pure hypercholesterolemia, unspecified: Secondary | ICD-10-CM

## 2011-04-18 LAB — ALT: ALT: 47 U/L (ref 0–53)

## 2011-04-18 LAB — LIPID PANEL
Cholesterol: 188 mg/dL (ref 0–200)
HDL: 43.6 mg/dL (ref >39.00)
Triglycerides: 61 mg/dL (ref 0.0–149.0)
VLDL: 12.2 mg/dL (ref 0.0–40.0)

## 2011-11-05 ENCOUNTER — Other Ambulatory Visit: Payer: Self-pay | Admitting: Family Medicine

## 2011-11-07 NOTE — Telephone Encounter (Signed)
[facility] request refill Lisinopril 10 mg. Pt last seen 12/24/10 for laceration. Is it OK to refill?

## 2011-11-07 NOTE — Telephone Encounter (Signed)
Please sched appt spring or early summer- thanks Will refill electronically

## 2011-11-09 NOTE — Telephone Encounter (Signed)
Spoke with pt and scheduled a f/u early spring or summer.

## 2012-01-03 ENCOUNTER — Other Ambulatory Visit: Payer: Self-pay | Admitting: Family Medicine

## 2012-03-02 ENCOUNTER — Other Ambulatory Visit: Payer: Self-pay | Admitting: Family Medicine

## 2012-03-06 ENCOUNTER — Encounter: Payer: Self-pay | Admitting: Family Medicine

## 2012-03-06 ENCOUNTER — Ambulatory Visit (INDEPENDENT_AMBULATORY_CARE_PROVIDER_SITE_OTHER): Payer: BC Managed Care – PPO | Admitting: Family Medicine

## 2012-03-06 VITALS — BP 122/80 | HR 69 | Temp 98.0°F | Ht 68.0 in | Wt 272.0 lb

## 2012-03-06 DIAGNOSIS — E785 Hyperlipidemia, unspecified: Secondary | ICD-10-CM

## 2012-03-06 DIAGNOSIS — I10 Essential (primary) hypertension: Secondary | ICD-10-CM

## 2012-03-06 DIAGNOSIS — E669 Obesity, unspecified: Secondary | ICD-10-CM

## 2012-03-06 LAB — COMPREHENSIVE METABOLIC PANEL
Albumin: 4 g/dL (ref 3.5–5.2)
CO2: 24 mEq/L (ref 19–32)
GFR: 82.56 mL/min (ref >60.00)
Glucose, Bld: 106 mg/dL — ABNORMAL HIGH (ref 70–99)
Potassium: 3.9 mEq/L (ref 3.5–5.1)
Sodium: 142 mEq/L (ref 135–145)
Total Protein: 7.1 g/dL (ref 6.0–8.3)

## 2012-03-06 LAB — CBC WITH DIFFERENTIAL/PLATELET
Basophils Relative: 0 % (ref 0.0–3.0)
Eosinophils Relative: 2.2 % (ref 0.0–5.0)
HCT: 45.8 % (ref 39.0–52.0)
Lymphs Abs: 2.1 10*3/uL (ref 0.7–4.0)
Monocytes Relative: 9.1 % (ref 3.0–12.0)
Neutrophils Relative %: 55.9 % (ref 43.0–77.0)
Platelets: 173 10*3/uL (ref 150.0–400.0)
RBC: 5.06 Mil/uL (ref 4.22–5.81)
WBC: 6.4 10*3/uL (ref 4.5–10.5)

## 2012-03-06 LAB — TSH: TSH: 1.74 u[IU]/mL (ref 0.35–5.50)

## 2012-03-06 MED ORDER — LISINOPRIL 10 MG PO TABS: 10.0000 mg | ORAL_TABLET | Freq: Every day | ORAL | Status: DC

## 2012-03-06 NOTE — Patient Instructions (Addendum)
Work on diet and exercise for weight loss Blood pressure is stable Labs today No change in medicines

## 2012-03-06 NOTE — Progress Notes (Signed)
Subjective:    Patient ID: Clinton Fowler, male    DOB: 1966-08-28, 46 y.o.   MRN: [id number]  HPI Here for f/u of HTN Is doing pretty well overall  Nothing new going on   bp is 136/81    Today No cp or palpitations or headaches or edema  No side effects to medicines  - on prinivil  BP Readings from Last 3 Encounters:  03/06/12 136/81  12/24/10 122/80  10/13/10 126/78   Wt is up 7 lb with bmi of 41 Obese  Has not been eating well - too much , due to rain and lack of work  A little exercise   Due for labs  Does watch cholesterol in diet - red meat and fried foods Lab Results  Component Value Date   CHOL 188 04/18/2011   HDL 43.60 04/18/2011   LDLCALC 132* 04/18/2011   LDLDIRECT 161.6 10/09/2009   TRIG 61.0 04/18/2011   CHOLHDL 4 04/18/2011    Patient Active Problem List  Diagnosis  . HYPERLIPIDEMIA  . ESSENTIAL HYPERTENSION, BENIGN  . TRANSAMINASES, SERUM, ELEVATED  . NEOPLASM OF UNCERTAIN BEHAVIOR OF SKIN  . Laceration of head  . Obese   No past medical history on file. No past surgical history on file. History  Substance Use Topics  . Smoking status: Never Smoker   . Smokeless tobacco: Not on file   Comment: Never smoked on a regular basis, only occasionally. Not anymore at all  . Alcohol Use: No   No family history on file. No Known Allergies Current Outpatient Prescriptions on File Prior to Visit  Medication Sig Dispense Refill  . lisinopril (PRINIVIL,ZESTRIL) 10 MG tablet Take 1 tablet (10 mg total) by mouth daily.  30 tablet  11  . omeprazole (PRILOSEC) 20 MG capsule Take 20 mg by mouth daily.           Review of Systems Review of Systems  Constitutional: Negative for fever, appetite change, fatigue and unexpected weight change.  Eyes: Negative for pain and visual disturbance.  Respiratory: Negative for cough and shortness of breath.   Cardiovascular: Negative for cp or palpitations    Gastrointestinal: Negative for nausea, diarrhea and constipation.    Genitourinary: Negative for urgency and frequency.  Skin: Negative for pallor or rash   Neurological: Negative for weakness, light-headedness, numbness and headaches.  Hematological: Negative for adenopathy. Does not bruise/bleed easily.  Psychiatric/Behavioral: Negative for dysphoric mood. The patient is not nervous/anxious.         Objective:   Physical Exam  Constitutional: He appears well-developed and well-nourished. No distress.       Obese and well appearing   HENT:  Head: Normocephalic and atraumatic.  Right Ear: External ear normal.  Left Ear: External ear normal.  Nose: Nose normal.  Mouth/Throat: Oropharynx is clear and moist.       Scant cerumen bilat  Eyes: Conjunctivae and EOM are normal. Pupils are equal, round, and reactive to light. No scleral icterus.  Neck: Normal range of motion. Neck supple. No JVD present. Carotid bruit is not present. Erythema present. No thyromegaly present.  Cardiovascular: Normal rate, regular rhythm, normal heart sounds and intact distal pulses.  Exam reveals no gallop.   Pulmonary/Chest: Effort normal and breath sounds normal. No respiratory distress. He has no wheezes.  Abdominal: Soft. Bowel sounds are normal. He exhibits no distension, no abdominal bruit and no mass. There is no tenderness.  Musculoskeletal: Normal range of motion. He exhibits no edema  and no tenderness.  Lymphadenopathy:    He has no cervical adenopathy.  Neurological: He is alert. He has normal reflexes. No cranial nerve deficit. He exhibits normal muscle tone. Coordination normal.  Skin: Skin is warm and dry. No rash noted. No erythema. No pallor.       Ruddy complexion  Psychiatric: He has a normal mood and affect.          Assessment & Plan:

## 2012-03-06 NOTE — Assessment & Plan Note (Signed)
bp in fair control at this time  No changes needed  Disc lifstyle change with low sodium diet and exercise   Will refil zestril Labs today

## 2012-03-06 NOTE — Assessment & Plan Note (Signed)
Lab today Pt states he does watch diet , though has gained wt Interested to see how LFTs are also

## 2012-03-06 NOTE — Assessment & Plan Note (Signed)
Alerted pt that his bmi is quite high Discussed how this problem influences overall health and the risks it imposes  Reviewed plan for weight loss with lower calorie diet (via better food choices and also portion control or program like weight watchers) and exercise building up to or more than 30 minutes 5 days per week including some aerobic activity

## 2012-06-29 ENCOUNTER — Other Ambulatory Visit: Payer: Self-pay | Admitting: Family Medicine

## 2012-07-06 ENCOUNTER — Ambulatory Visit: Payer: BC Managed Care – PPO | Admitting: Family Medicine

## 2012-07-12 ENCOUNTER — Encounter: Payer: Self-pay | Admitting: Family Medicine

## 2012-07-13 ENCOUNTER — Ambulatory Visit (INDEPENDENT_AMBULATORY_CARE_PROVIDER_SITE_OTHER): Payer: BC Managed Care – PPO | Admitting: Family Medicine

## 2012-07-13 ENCOUNTER — Encounter: Payer: Self-pay | Admitting: Family Medicine

## 2012-07-13 VITALS — BP 130/80 | HR 74 | Temp 98.4°F | Ht 68.0 in | Wt 270.5 lb

## 2012-07-13 DIAGNOSIS — E669 Obesity, unspecified: Secondary | ICD-10-CM

## 2012-07-13 DIAGNOSIS — I10 Essential (primary) hypertension: Secondary | ICD-10-CM

## 2012-07-13 NOTE — Progress Notes (Signed)
Subjective:    Patient ID: Clinton Fowler, male    DOB: 01/15/1966, 46 y.o.   MRN: [id number]  HPI Here for f/u of HTN and obesity/ fatty liver  bp is up a bit  today  No cp or palpitations or headaches or edema  No side effects to medicines  BP Readings from Last 3 Encounters:  07/13/12 146/84  03/06/12 122/80  12/24/10 122/80    On prinivil Took his medicine about an hour ago  No elevated bp outside of the office  bp better on 2nd check with large cuff 130/80  Lab Results  Component Value Date   ALT 60* 03/06/2012   AST 38* 03/06/2012   ALKPHOS 38* 03/06/2012   BILITOT 0.8 03/06/2012   Does not drink alcohol or take tylenol   According to his scales he weighs 265- has lost some weight  Work has slowed down with winter coming on  Is cutting back and eating more grilled / less fried foods  No exercise when he is not working a lot   Is willing to start a P 90 X like program - has a program Also will walk with his wife   Patient Active Problem List  Diagnosis  . HYPERLIPIDEMIA  . ESSENTIAL HYPERTENSION, BENIGN  . TRANSAMINASES, SERUM, ELEVATED  . NEOPLASM OF UNCERTAIN BEHAVIOR OF SKIN  . Laceration of head  . Obese   Past Medical History  Diagnosis Date  . History of gastroesophageal reflux (GERD)   . History of hyperlipidemia   . Obesity   . Biceps tendon rupture     history of biceps tendon rupture bilaterally  . History of hypertension   . Abnormal transaminases     elevated transaminases (likely fatty liver)   Past Surgical History  Procedure Date  . Biceps tendon repair     ruptured biceps   History  Substance Use Topics  . Smoking status: Never Smoker   . Smokeless tobacco: Not on file     Comment: Never smoked on a regular basis, only occasionally. Not anymore at all  . Alcohol Use: No   Family History  Problem Relation Age of Onset  . Hypertension Father   . Prostate cancer Father   . Cancer - Other Mother     mouth cancer  . Coronary artery  disease Other     Grandmother  . Coronary artery disease Other     Grandfather   No Known Allergies Current Outpatient Prescriptions on File Prior to Visit  Medication Sig Dispense Refill  . lisinopril (PRINIVIL,ZESTRIL) 10 MG tablet Take 1 tablet (10 mg total) by mouth daily.  30 tablet  11  . omeprazole (PRILOSEC) 20 MG capsule Take 20 mg by mouth daily.            Review of Systems Review of Systems  Constitutional: Negative for fever, appetite change, fatigue and unexpected weight change.  Eyes: Negative for pain and visual disturbance.  Respiratory: Negative for cough and shortness of breath.   Cardiovascular: Negative for cp or palpitations    Gastrointestinal: Negative for nausea, diarrhea and constipation.  Genitourinary: Negative for urgency and frequency.  Skin: Negative for pallor or rash   Neurological: Negative for weakness, light-headedness, numbness and headaches.  Hematological: Negative for adenopathy. Does not bruise/bleed easily.  Psychiatric/Behavioral: Negative for dysphoric mood. The patient is not nervous/anxious.         Objective:   Physical Exam  Constitutional: He appears well-developed and well-nourished. No distress.  obese and well appearing   HENT:  Head: Normocephalic and atraumatic.  Mouth/Throat: Oropharynx is clear and moist.  Eyes: Conjunctivae normal and EOM are normal. Pupils are equal, round, and reactive to light. No scleral icterus.  Neck: Normal range of motion. Neck supple. No JVD present. Carotid bruit is not present. No thyromegaly present.  Cardiovascular: Normal rate, regular rhythm, normal heart sounds and intact distal pulses.  Exam reveals no gallop.   Pulmonary/Chest: Effort normal and breath sounds normal. No respiratory distress. He has no wheezes.  Abdominal: Soft. Bowel sounds are normal. He exhibits no distension and no abdominal bruit. There is no tenderness.  Musculoskeletal: Normal range of motion. He exhibits no  edema.  Lymphadenopathy:    He has no cervical adenopathy.  Neurological: He is alert. He has normal reflexes. No cranial nerve deficit. He exhibits normal muscle tone. Coordination normal.  Skin: Skin is warm and dry. No rash noted. No erythema. No pallor.  Psychiatric: He has a normal mood and affect.          Assessment & Plan:

## 2012-07-13 NOTE — Assessment & Plan Note (Signed)
bp in fair control at this time  No changes needed  Disc lifstyle change with low sodium diet and exercise   Better on 2nd check today Stressed imp of wt loss

## 2012-07-13 NOTE — Assessment & Plan Note (Signed)
Rev last labs with elevated ast/alt from fatty liver - mild Disc imp of wt loss

## 2012-07-13 NOTE — Assessment & Plan Note (Signed)
Discussed how this problem influences overall health and the risks it imposes  Reviewed plan for weight loss with lower calorie diet (via better food choices and also portion control or program like weight watchers) and exercise building up to or more than 30 minutes 5 days per week including some aerobic activity    

## 2012-07-13 NOTE — Patient Instructions (Addendum)
Work hard on weight loss Aim for 30 minutes of exercise 5 days per week  Avoid red meat/ fried foods/ egg yolks/ fatty breakfast meats/ butter, cheese and high fat dairy/ and shellfish   Cut portions by 1/3 Follow up in 6 month for annual exam with labs prior

## 2012-09-07 ENCOUNTER — Encounter: Payer: Self-pay | Admitting: Family Medicine

## 2012-09-07 ENCOUNTER — Ambulatory Visit (INDEPENDENT_AMBULATORY_CARE_PROVIDER_SITE_OTHER): Payer: BC Managed Care – PPO | Admitting: Family Medicine

## 2012-09-07 VITALS — BP 124/78 | HR 75 | Temp 97.8°F | Ht 68.0 in | Wt 276.0 lb

## 2012-09-07 DIAGNOSIS — Z125 Encounter for screening for malignant neoplasm of prostate: Secondary | ICD-10-CM

## 2012-09-07 DIAGNOSIS — R7309 Other abnormal glucose: Secondary | ICD-10-CM

## 2012-09-07 DIAGNOSIS — Z0000 Encounter for general adult medical examination without abnormal findings: Secondary | ICD-10-CM

## 2012-09-07 DIAGNOSIS — R739 Hyperglycemia, unspecified: Secondary | ICD-10-CM

## 2012-09-07 DIAGNOSIS — R7303 Prediabetes: Secondary | ICD-10-CM | POA: Insufficient documentation

## 2012-09-07 DIAGNOSIS — E669 Obesity, unspecified: Secondary | ICD-10-CM

## 2012-09-07 DIAGNOSIS — I10 Essential (primary) hypertension: Secondary | ICD-10-CM

## 2012-09-07 DIAGNOSIS — E785 Hyperlipidemia, unspecified: Secondary | ICD-10-CM

## 2012-09-07 DIAGNOSIS — Z23 Encounter for immunization: Secondary | ICD-10-CM

## 2012-09-07 LAB — LIPID PANEL
Cholesterol: 204 mg/dL — ABNORMAL HIGH (ref 0–200)
Total CHOL/HDL Ratio: 5
Triglycerides: 105 mg/dL (ref 0.0–149.0)
VLDL: 21 mg/dL (ref 0.0–40.0)

## 2012-09-07 LAB — COMPREHENSIVE METABOLIC PANEL
ALT: 56 U/L — ABNORMAL HIGH (ref 0–53)
AST: 32 U/L (ref 0–37)
Alkaline Phosphatase: 36 U/L — ABNORMAL LOW (ref 39–117)
Creatinine, Ser: 0.9 mg/dL (ref 0.4–1.5)
GFR: 99.88 mL/min (ref >60.00)
Total Bilirubin: 0.8 mg/dL (ref 0.3–1.2)

## 2012-09-07 LAB — CBC WITH DIFFERENTIAL/PLATELET
Basophils Absolute: 0 10*3/uL (ref 0.0–0.1)
Eosinophils Absolute: 0.1 10*3/uL (ref 0.0–0.7)
HCT: 45.8 % (ref 39.0–52.0)
Hemoglobin: 15.7 g/dL (ref 13.0–17.0)
Lymphs Abs: 1.9 10*3/uL (ref 0.7–4.0)
MCHC: 34.2 g/dL (ref 30.0–36.0)
Neutro Abs: 4 10*3/uL (ref 1.4–7.7)
RDW: 13.5 % (ref 11.5–14.6)

## 2012-09-07 LAB — POCT URINALYSIS DIPSTICK
Bilirubin, UA: NEGATIVE
Blood, UA: NEGATIVE
Ketones, UA: NEGATIVE
Nitrite, UA: NEGATIVE
Protein, UA: NEGATIVE
pH, UA: 7

## 2012-09-07 NOTE — Assessment & Plan Note (Signed)
Discussed how this problem influences overall health and the risks it imposes  Reviewed plan for weight loss with lower calorie diet (via better food choices and also portion control or program like weight watchers) and exercise building up to or more than 30 minutes 5 days per week including some aerobic activity    He has not been very motivated - but plans to start

## 2012-09-07 NOTE — Patient Instructions (Addendum)
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

## 2012-09-07 NOTE — Assessment & Plan Note (Signed)
Lab today Fatty liver and obesity Has gained wt unfortunately

## 2012-09-07 NOTE — Assessment & Plan Note (Signed)
Sugar slt high last visit  a1c today Is at risk for DM in light of obesity and this was discussed

## 2012-09-07 NOTE — Assessment & Plan Note (Signed)
Pt has fam hx of prostate cancer in father - but no sympt at all  psa today DRE was unremarkable

## 2012-09-07 NOTE — Assessment & Plan Note (Signed)
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

## 2012-09-07 NOTE — Progress Notes (Signed)
Subjective:    Patient ID: Clinton Fowler, male    DOB: Nov 06, 1965, 47 y.o.   MRN: [id number]  HPI Here for health maintenance exam and to review chronic medical problems  And also DOT physical  Is feeling good  Trying to take care of himself   Wt is up 6 lb with bmi of 41 Has to do with the holidays He has cut back a lot portion wise  ? If really eating smarter -- will keep trying  He does avoid sweets , but still some fried foods , and beef periodically  Due for labs today  Hx of elevated transaminases/ fatty liver Needs to loose wt and eat better- is aware of that   Also hx of hyperlipidemia Lab Results  Component Value Date   CHOL 221* 03/06/2012   HDL 44.00 03/06/2012   LDLCALC 132* 04/18/2011   LDLDIRECT 157.4 03/06/2012   TRIG 67.0 03/06/2012   CHOLHDL 5 03/06/2012    Father had prostate cancer Lab Results  Component Value Date   PSA 0.85 10/03/2008   PSA 0.59 10/02/2008   PSA 0.59 10/03/2006  no urination changes at all  No nocturia at all  Stream is fine     Mood-has been ok , not depressed   Flu vaccine - has not had it- and will go ahead and get it today  bp is stable today  No cp or palpitations or headaches or edema  No side effects to medicines  BP Readings from Last 3 Encounters:  09/07/12 124/78  07/13/12 130/80  03/06/12 122/80    This is very good   Patient Active Problem List  Diagnosis  . HYPERLIPIDEMIA  . ESSENTIAL HYPERTENSION, BENIGN  . TRANSAMINASES, SERUM, ELEVATED  . NEOPLASM OF UNCERTAIN BEHAVIOR OF SKIN  . Laceration of head  . Obese  . Routine general medical examination at a health care facility   Past Medical History  Diagnosis Date  . History of gastroesophageal reflux (GERD)   . History of hyperlipidemia   . Obesity   . Biceps tendon rupture     history of biceps tendon rupture bilaterally  . History of hypertension   . Abnormal transaminases     elevated transaminases (likely fatty liver)   Past Surgical History    Procedure Date  . Biceps tendon repair     ruptured biceps   History  Substance Use Topics  . Smoking status: Never Smoker   . Smokeless tobacco: Not on file     Comment: Never smoked on a regular basis, only occasionally. Not anymore at all  . Alcohol Use: No   Family History  Problem Relation Age of Onset  . Hypertension Father   . Prostate cancer Father   . Cancer - Other Mother     mouth cancer  . Coronary artery disease Other     Grandmother  . Coronary artery disease Other     Grandfather   No Known Allergies Current Outpatient Prescriptions on File Prior to Visit  Medication Sig Dispense Refill  . lisinopril (PRINIVIL,ZESTRIL) 10 MG tablet Take 1 tablet (10 mg total) by mouth daily.  30 tablet  11  . omeprazole (PRILOSEC) 20 MG capsule Take 20 mg by mouth daily.            Review of Systems Review of Systems  Constitutional: Negative for fever, appetite change, fatigue and unexpected weight change.  Eyes: Negative for pain and visual disturbance.  Respiratory: Negative for cough and  shortness of breath.   Cardiovascular: Negative for cp or palpitations    Gastrointestinal: Negative for nausea, diarrhea and constipation.  Genitourinary: Negative for urgency and frequency.  Skin: Negative for pallor or rash   Neurological: Negative for weakness, light-headedness, numbness and headaches.  Hematological: Negative for adenopathy. Does not bruise/bleed easily.  Psychiatric/Behavioral: Negative for dysphoric mood. The patient is not nervous/anxious.         Objective:   Physical Exam  Constitutional: He appears well-developed and well-nourished. No distress.       obese and well appearing   HENT:  Head: Normocephalic and atraumatic.  Right Ear: External ear normal.  Left Ear: External ear normal.  Nose: Nose normal.  Mouth/Throat: Oropharynx is clear and moist.  Eyes: Conjunctivae normal and EOM are normal. Pupils are equal, round, and reactive to light. Right  eye exhibits no discharge. Left eye exhibits no discharge. No scleral icterus.  Neck: Normal range of motion. Neck supple. No JVD present. Carotid bruit is not present. No thyromegaly present.  Cardiovascular: Normal rate, regular rhythm, normal heart sounds and intact distal pulses.  Exam reveals no gallop.   Pulmonary/Chest: Effort normal and breath sounds normal. No respiratory distress. He has no wheezes.  Abdominal: Soft. Bowel sounds are normal. He exhibits no distension, no abdominal bruit and no mass. There is no tenderness.  Genitourinary: Rectum normal and prostate normal.  Musculoskeletal: Normal range of motion. He exhibits no edema and no tenderness.  Lymphadenopathy:    He has no cervical adenopathy.  Neurological: He is alert. He has normal reflexes. No cranial nerve deficit. He exhibits normal muscle tone. Coordination normal.  Skin: Skin is warm and dry. No rash noted. No erythema. No pallor.       Ruddy complexion  Psychiatric: He has a normal mood and affect.          Assessment & Plan:

## 2012-09-07 NOTE — Assessment & Plan Note (Signed)
bp in fair control at this time  No changes needed  Disc lifstyle change with low sodium diet and exercise   Labs today 

## 2012-09-07 NOTE — Assessment & Plan Note (Signed)
Lab today  Pt is eating less but not nec low sat fat  Rev low sat fat diet and need for wt loss  May need statin- open to that if necessary

## 2012-09-11 ENCOUNTER — Other Ambulatory Visit: Payer: Self-pay | Admitting: *Deleted

## 2012-09-11 MED ORDER — ATORVASTATIN CALCIUM 20 MG PO TABS: 20.0000 mg | ORAL_TABLET | Freq: Every day | ORAL | Status: DC

## 2012-10-15 ENCOUNTER — Other Ambulatory Visit (INDEPENDENT_AMBULATORY_CARE_PROVIDER_SITE_OTHER): Payer: BC Managed Care – PPO

## 2012-10-15 DIAGNOSIS — E7800 Pure hypercholesterolemia, unspecified: Secondary | ICD-10-CM

## 2012-10-15 LAB — LIPID PANEL
LDL Cholesterol: 85 mg/dL (ref 0–99)
Total CHOL/HDL Ratio: 4
Triglycerides: 72 mg/dL (ref 0.0–149.0)

## 2012-10-15 LAB — AST: AST: 34 U/L (ref 0–37)

## 2012-10-15 LAB — ALT: ALT: 50 U/L (ref 0–53)

## 2012-10-16 ENCOUNTER — Encounter: Payer: Self-pay | Admitting: *Deleted

## 2013-01-27 ENCOUNTER — Telehealth: Payer: Self-pay | Admitting: Family Medicine

## 2013-01-27 DIAGNOSIS — R739 Hyperglycemia, unspecified: Secondary | ICD-10-CM

## 2013-01-27 DIAGNOSIS — E785 Hyperlipidemia, unspecified: Secondary | ICD-10-CM

## 2013-01-27 DIAGNOSIS — I10 Essential (primary) hypertension: Secondary | ICD-10-CM

## 2013-01-27 NOTE — Telephone Encounter (Signed)
Message copied by Judy Pimple on Sun Jan 27, 2013 12:11 PM ------      Message from: Alvina Chou      Created: Fri Jan 11, 2013 11:48 AM      Regarding: lab orders for Wednesday, 5.28.14       Labs for a f/u appt ------

## 2013-01-30 ENCOUNTER — Other Ambulatory Visit (INDEPENDENT_AMBULATORY_CARE_PROVIDER_SITE_OTHER): Payer: BC Managed Care – PPO

## 2013-01-30 DIAGNOSIS — R7309 Other abnormal glucose: Secondary | ICD-10-CM

## 2013-01-30 DIAGNOSIS — E785 Hyperlipidemia, unspecified: Secondary | ICD-10-CM

## 2013-01-30 DIAGNOSIS — I10 Essential (primary) hypertension: Secondary | ICD-10-CM

## 2013-01-30 DIAGNOSIS — Z0000 Encounter for general adult medical examination without abnormal findings: Secondary | ICD-10-CM

## 2013-01-30 DIAGNOSIS — R739 Hyperglycemia, unspecified: Secondary | ICD-10-CM

## 2013-01-30 LAB — COMPREHENSIVE METABOLIC PANEL
ALT: 40 U/L (ref 0–53)
AST: 27 U/L (ref 0–37)
Calcium: 8.6 mg/dL (ref 8.4–10.5)
Chloride: 105 mEq/L (ref 96–112)
Creatinine, Ser: 1.1 mg/dL (ref 0.4–1.5)
Sodium: 138 mEq/L (ref 135–145)
Total Bilirubin: 0.7 mg/dL (ref 0.3–1.2)
Total Protein: 6.6 g/dL (ref 6.0–8.3)

## 2013-01-30 LAB — LIPID PANEL
HDL: 38.4 mg/dL — ABNORMAL LOW (ref >39.00)
Total CHOL/HDL Ratio: 3
VLDL: 14.8 mg/dL (ref 0.0–40.0)

## 2013-01-30 LAB — HEMOGLOBIN A1C: Hgb A1c MFr Bld: 6 % (ref 4.6–6.5)

## 2013-02-04 ENCOUNTER — Encounter: Payer: BC Managed Care – PPO | Admitting: Family Medicine

## 2013-02-05 ENCOUNTER — Encounter: Payer: Self-pay | Admitting: Family Medicine

## 2013-02-05 ENCOUNTER — Ambulatory Visit (INDEPENDENT_AMBULATORY_CARE_PROVIDER_SITE_OTHER): Payer: BC Managed Care – PPO | Admitting: Family Medicine

## 2013-02-05 VITALS — BP 118/76 | HR 79 | Temp 98.6°F | Ht 68.0 in | Wt 271.5 lb

## 2013-02-05 DIAGNOSIS — I10 Essential (primary) hypertension: Secondary | ICD-10-CM

## 2013-02-05 DIAGNOSIS — E785 Hyperlipidemia, unspecified: Secondary | ICD-10-CM

## 2013-02-05 DIAGNOSIS — R739 Hyperglycemia, unspecified: Secondary | ICD-10-CM

## 2013-02-05 DIAGNOSIS — R7309 Other abnormal glucose: Secondary | ICD-10-CM

## 2013-02-05 MED ORDER — LISINOPRIL 10 MG PO TABS: 10.0000 mg | ORAL_TABLET | Freq: Every day | ORAL | Status: DC

## 2013-02-05 NOTE — Assessment & Plan Note (Signed)
bp in fair control at this time  No changes needed  Disc lifstyle change with low sodium diet and exercise  Refilled lisinopril Disc hydration working outdoors Lab rev

## 2013-02-05 NOTE — Patient Instructions (Addendum)
Try to switch to water and sugar free drinks (also avoid juice) Stay active  Keep working on portion control and weight loss  Follow up in 6 months for annual exam with labs prior

## 2013-02-05 NOTE — Progress Notes (Signed)
Subjective:    Patient ID: Clinton Fowler, male    DOB: 19-Mar-1966, 47 y.o.   MRN: [id number]  HPI Here for f/u of chronic medical problems   Wt is down 5 lb with bmi of 41 Is happy about that  Has cut back portion sizes some  Not really eating certain things   Hyperglycemia Lab Results  Component Value Date   HGBA1C 6.0 01/30/2013   this is down from 6.1 Sugar is 113 fasting  Does not eat sweets very often - about once per week Does drink sweet tea and pepsi   On lipitor 20 for chol Lab Results  Component Value Date   CHOL 130 01/30/2013   CHOL 135 10/15/2012   CHOL 204* 09/07/2012   Lab Results  Component Value Date   HDL 38.40* 01/30/2013   HDL 35.90* 10/15/2012   HDL 39.10 09/07/2012   Lab Results  Component Value Date   LDLCALC 77 01/30/2013   LDLCALC 85 10/15/2012   LDLCALC 132* 04/18/2011   Lab Results  Component Value Date   TRIG 74.0 01/30/2013   TRIG 72.0 10/15/2012   TRIG 105.0 09/07/2012   Lab Results  Component Value Date   CHOLHDL 3 01/30/2013   CHOLHDL 4 10/15/2012   CHOLHDL 5 09/07/2012   Lab Results  Component Value Date   LDLDIRECT 160.7 09/07/2012   LDLDIRECT 157.4 03/06/2012   LDLDIRECT 161.6 10/09/2009    Lab Results  Component Value Date   ALT 40 01/30/2013   AST 27 01/30/2013   ALKPHOS 40 01/30/2013   BILITOT 0.7 01/30/2013   liver tests normal   bp is stable today  No cp or palpitations or headaches or edema  No side effects to medicines  BP Readings from Last 3 Encounters:  02/05/13 118/76  09/07/12 124/78  07/13/12 130/80      Patient Active Problem List   Diagnosis Date Noted  . Routine general medical examination at a health care facility 09/07/2012  . Hyperglycemia 09/07/2012  . Prostate cancer screening 09/07/2012  . Obese 03/06/2012  . TRANSAMINASES, SERUM, ELEVATED 11/10/2009  . ESSENTIAL HYPERTENSION, BENIGN 10/09/2009  . HYPERLIPIDEMIA 10/02/2008   Past Medical History  Diagnosis Date  . History of gastroesophageal reflux  (GERD)   . History of hyperlipidemia   . Obesity   . Biceps tendon rupture     history of biceps tendon rupture bilaterally  . History of hypertension   . Abnormal transaminases     elevated transaminases (likely fatty liver)   Past Surgical History  Procedure Laterality Date  . Biceps tendon repair      ruptured biceps   History  Substance Use Topics  . Smoking status: Never Smoker   . Smokeless tobacco: Not on file     Comment: Never smoked on a regular basis, only occasionally. Not anymore at all  . Alcohol Use: No   Family History  Problem Relation Age of Onset  . Hypertension Father   . Prostate cancer Father   . Cancer - Other Mother     mouth cancer  . Coronary artery disease Other     Grandmother  . Coronary artery disease Other     Grandfather   No Known Allergies Current Outpatient Prescriptions on File Prior to Visit  Medication Sig Dispense Refill  . atorvastatin (LIPITOR) 20 MG tablet Take 1 tablet (20 mg total) by mouth daily.  30 tablet  11  . lisinopril (PRINIVIL,ZESTRIL) 10 MG tablet Take 1 tablet (  10 mg total) by mouth daily.  30 tablet  11  . omeprazole (PRILOSEC) 20 MG capsule Take 20 mg by mouth daily.         No current facility-administered medications on file prior to visit.    Review of Systems Review of Systems  Constitutional: Negative for fever, appetite change, fatigue and unexpected weight change.  Eyes: Negative for pain and visual disturbance.  Respiratory: Negative for cough and shortness of breath.   Cardiovascular: Negative for cp or palpitations    Gastrointestinal: Negative for nausea, diarrhea and constipation.  Genitourinary: Negative for urgency and frequency.  Skin: Negative for pallor or rash   Neurological: Negative for weakness, light-headedness, numbness and headaches.  Hematological: Negative for adenopathy. Does not bruise/bleed easily.  Psychiatric/Behavioral: Negative for dysphoric mood. The patient is not  nervous/anxious.         Objective:   Physical Exam  Constitutional: He appears well-developed and well-nourished. No distress.  HENT:  Head: Normocephalic and atraumatic.  Right Ear: External ear normal.  Left Ear: External ear normal.  Nose: Nose normal.  Mouth/Throat: Oropharynx is clear and moist.  Eyes: Conjunctivae and EOM are normal. Pupils are equal, round, and reactive to light. Right eye exhibits no discharge. Left eye exhibits no discharge. No scleral icterus.  Neck: Normal range of motion. Neck supple. No JVD present. Carotid bruit is not present. No thyromegaly present.  Cardiovascular: Normal rate, regular rhythm, normal heart sounds and intact distal pulses.  Exam reveals no gallop.   Pulmonary/Chest: Breath sounds normal. No respiratory distress. He has no wheezes.  Abdominal: Soft. Bowel sounds are normal. He exhibits no distension, no abdominal bruit and no mass. There is no tenderness.  Musculoskeletal: He exhibits no edema.  Lymphadenopathy:    He has no cervical adenopathy.  Neurological: He is alert. He has normal reflexes. No cranial nerve deficit. He exhibits normal muscle tone. Coordination normal.  Skin: Skin is warm and dry. No rash noted. No erythema. No pallor.  Psychiatric: He has a normal mood and affect.          Assessment & Plan:

## 2013-02-05 NOTE — Assessment & Plan Note (Signed)
Stable A1c of 6.0 Disc lowering sugar in diet and wt loss Disc quitting sugar beverages-pt does not think he is motivated enough to do that- even understanding risks of DM Lab rev in detail

## 2013-02-05 NOTE — Assessment & Plan Note (Signed)
Disc goals for lipids and reasons to control them Rev labs with pt Rev low sat fat diet in detail Much imp with statin and diet Hope HDL will go up further with exercise/ work

## 2013-09-02 ENCOUNTER — Other Ambulatory Visit: Payer: Self-pay | Admitting: Family Medicine

## 2013-09-02 ENCOUNTER — Telehealth: Payer: Self-pay | Admitting: Family Medicine

## 2013-09-02 DIAGNOSIS — Z125 Encounter for screening for malignant neoplasm of prostate: Secondary | ICD-10-CM

## 2013-09-02 DIAGNOSIS — Z0000 Encounter for general adult medical examination without abnormal findings: Secondary | ICD-10-CM

## 2013-09-02 DIAGNOSIS — R739 Hyperglycemia, unspecified: Secondary | ICD-10-CM

## 2013-09-02 NOTE — Telephone Encounter (Signed)
Message copied by Judy Pimple on Mon Sep 02, 2013  8:07 AM ------      Message from: Alvina Chou      Created: Wed Aug 28, 2013 10:43 AM      Regarding: Lab orders for Tuesday, 12.30.14       Patient is scheduled for CPX labs, please order future labs, Thanks , Terri       ------

## 2013-09-04 ENCOUNTER — Other Ambulatory Visit (INDEPENDENT_AMBULATORY_CARE_PROVIDER_SITE_OTHER): Payer: BC Managed Care – PPO

## 2013-09-04 DIAGNOSIS — R7309 Other abnormal glucose: Secondary | ICD-10-CM

## 2013-09-04 DIAGNOSIS — R739 Hyperglycemia, unspecified: Secondary | ICD-10-CM

## 2013-09-04 DIAGNOSIS — I10 Essential (primary) hypertension: Secondary | ICD-10-CM

## 2013-09-04 DIAGNOSIS — Z0000 Encounter for general adult medical examination without abnormal findings: Secondary | ICD-10-CM

## 2013-09-04 DIAGNOSIS — E785 Hyperlipidemia, unspecified: Secondary | ICD-10-CM

## 2013-09-04 DIAGNOSIS — Z125 Encounter for screening for malignant neoplasm of prostate: Secondary | ICD-10-CM

## 2013-09-04 LAB — COMPREHENSIVE METABOLIC PANEL
AST: 25 U/L (ref 0–37)
BUN: 12 mg/dL (ref 6–23)
CO2: 30 mEq/L (ref 19–32)
Calcium: 8.8 mg/dL (ref 8.4–10.5)
Chloride: 102 mEq/L (ref 96–112)
Creatinine, Ser: 1 mg/dL (ref 0.4–1.5)
GFR: 88.86 mL/min (ref >60.00)
Glucose, Bld: 100 mg/dL — ABNORMAL HIGH (ref 70–99)
Potassium: 4.4 mEq/L (ref 3.5–5.1)
Total Bilirubin: 1 mg/dL (ref 0.3–1.2)

## 2013-09-04 LAB — LIPID PANEL
Cholesterol: 158 mg/dL (ref 0–200)
Triglycerides: 79 mg/dL (ref 0.0–149.0)

## 2013-09-04 LAB — CBC WITH DIFFERENTIAL/PLATELET
Basophils Absolute: 0 10*3/uL (ref 0.0–0.1)
Basophils Relative: 0.2 % (ref 0.0–3.0)
Eosinophils Relative: 2.3 % (ref 0.0–5.0)
HCT: 45.7 % (ref 39.0–52.0)
Hemoglobin: 15.4 g/dL (ref 13.0–17.0)
Lymphs Abs: 2.7 10*3/uL (ref 0.7–4.0)
MCV: 88.8 fl (ref 78.0–100.0)
Monocytes Absolute: 0.7 10*3/uL (ref 0.1–1.0)
Neutro Abs: 5 10*3/uL (ref 1.4–7.7)
RBC: 5.14 Mil/uL (ref 4.22–5.81)
WBC: 8.6 10*3/uL (ref 4.5–10.5)

## 2013-09-04 LAB — TSH: TSH: 2.44 u[IU]/mL (ref 0.35–5.50)

## 2013-09-04 LAB — HEMOGLOBIN A1C: Hgb A1c MFr Bld: 6.1 % (ref 4.6–6.5)

## 2013-09-11 ENCOUNTER — Encounter: Payer: BC Managed Care – PPO | Admitting: Family Medicine

## 2013-09-13 ENCOUNTER — Ambulatory Visit (INDEPENDENT_AMBULATORY_CARE_PROVIDER_SITE_OTHER): Payer: BC Managed Care – PPO | Admitting: Family Medicine

## 2013-09-13 ENCOUNTER — Encounter: Payer: Self-pay | Admitting: Family Medicine

## 2013-09-13 VITALS — BP 128/74 | HR 80 | Temp 97.4°F | Ht 67.5 in | Wt 269.5 lb

## 2013-09-13 DIAGNOSIS — I10 Essential (primary) hypertension: Secondary | ICD-10-CM

## 2013-09-13 DIAGNOSIS — Z125 Encounter for screening for malignant neoplasm of prostate: Secondary | ICD-10-CM

## 2013-09-13 DIAGNOSIS — Z23 Encounter for immunization: Secondary | ICD-10-CM

## 2013-09-13 DIAGNOSIS — E669 Obesity, unspecified: Secondary | ICD-10-CM

## 2013-09-13 DIAGNOSIS — R7309 Other abnormal glucose: Secondary | ICD-10-CM

## 2013-09-13 DIAGNOSIS — Z0000 Encounter for general adult medical examination without abnormal findings: Secondary | ICD-10-CM

## 2013-09-13 DIAGNOSIS — E785 Hyperlipidemia, unspecified: Secondary | ICD-10-CM

## 2013-09-13 DIAGNOSIS — R739 Hyperglycemia, unspecified: Secondary | ICD-10-CM

## 2013-09-13 MED ORDER — OMEPRAZOLE 20 MG PO CPDR: 20.0000 mg | DELAYED_RELEASE_CAPSULE | Freq: Every day | ORAL | Status: DC

## 2013-09-13 MED ORDER — LISINOPRIL 10 MG PO TABS: 10.0000 mg | ORAL_TABLET | Freq: Every day | ORAL | Status: DC

## 2013-09-13 MED ORDER — ATORVASTATIN CALCIUM 20 MG PO TABS: ORAL_TABLET | ORAL | Status: DC

## 2013-09-13 NOTE — Patient Instructions (Signed)
Flu vaccine today  Please think about increasing exercise to increase the HDL (good cholesterol ) - also eating fish (with fins)  Avoid red meat/ fried foods/ egg yolks/ fatty breakfast meats/ butter, cheese and high fat dairy/ and shellfish   Your sugar control is stable - you are pre diabetic - and weight loss will help the most  Blood pressure is good   Fat and Cholesterol Control Diet Fat and cholesterol levels in your blood and organs are influenced by your diet. High levels of fat and cholesterol may lead to diseases of the heart, small and large blood vessels, gallbladder, liver, and pancreas. CONTROLLING FAT AND CHOLESTEROL WITH DIET Although exercise and lifestyle factors are important, your diet is key. That is because certain foods are known to raise cholesterol and others to lower it. The goal is to balance foods for their effect on cholesterol and more importantly, to replace saturated and trans fat with other types of fat, such as monounsaturated fat, polyunsaturated fat, and omega-3 fatty acids. On average, a person should consume no more than 15 to 17 g of saturated fat daily. Saturated and trans fats are considered "bad" fats, and they will raise LDL cholesterol. Saturated fats are primarily found in animal products such as meats, butter, and cream. However, that does not mean you need to give up all your favorite foods. Today, there are good tasting, low-fat, low-cholesterol substitutes for most of the things you like to eat. Choose low-fat or nonfat alternatives. Choose round or loin cuts of red meat. These types of cuts are lowest in fat and cholesterol. Chicken (without the skin), fish, veal, and ground [place] breast are great choices. Eliminate fatty meats, such as hot dogs and salami. Even shellfish have little or no saturated fat. Have a 3 oz (85 g) portion when you eat lean meat, poultry, or fish. Trans fats are also called "partially hydrogenated oils." They are oils that have  been scientifically manipulated so that they are solid at room temperature resulting in a longer shelf life and improved taste and texture of foods in which they are added. Trans fats are found in stick margarine, some tub margarines, cookies, crackers, and baked goods.  When baking and cooking, oils are a great substitute for butter. The monounsaturated oils are especially beneficial since it is believed they lower LDL and raise HDL. The oils you should avoid entirely are saturated tropical oils, such as coconut and palm.  Remember to eat a lot from food groups that are naturally free of saturated and trans fat, including fish, fruit, vegetables, beans, grains (barley, rice, couscous, bulgur wheat), and pasta (without cream sauces).  IDENTIFYING FOODS THAT LOWER FAT AND CHOLESTEROL  Soluble fiber may lower your cholesterol. This type of fiber is found in fruits such as apples, vegetables such as broccoli, potatoes, and carrots, legumes such as beans, peas, and lentils, and grains such as barley. Foods fortified with plant sterols (phytosterol) may also lower cholesterol. You should eat at least 2 g per day of these foods for a cholesterol lowering effect.  Read package labels to identify low-saturated fats, trans fat free, and low-fat foods at the supermarket. Select cheeses that have only 2 to 3 g saturated fat per ounce. Use a heart-healthy tub margarine that is free of trans fats or partially hydrogenated oil. When buying baked goods (cookies, crackers), avoid partially hydrogenated oils. Breads and muffins should be made from whole grains (whole-wheat or whole oat flour, instead of "flour" or "  enriched flour"). Buy non-creamy canned soups with reduced salt and no added fats.  FOOD PREPARATION TECHNIQUES  Never deep-fry. If you must fry, either stir-fry, which uses very little fat, or use non-stick cooking sprays. When possible, broil, bake, or roast meats, and steam vegetables. Instead of putting butter  or margarine on vegetables, use lemon and herbs, applesauce, and cinnamon (for squash and sweet potatoes). Use nonfat yogurt, salsa, and low-fat dressings for salads.  LOW-SATURATED FAT / LOW-FAT FOOD SUBSTITUTES Meats / Saturated Fat (g)  Avoid: Steak, marbled (3 oz/85 g) / 11 g  Choose: Steak, lean (3 oz/85 g) / 4 g  Avoid: Hamburger (3 oz/85 g) / 7 g  Choose: Hamburger, lean (3 oz/85 g) / 5 g  Avoid: Ham (3 oz/85 g) / 6 g  Choose: Ham, lean cut (3 oz/85 g) / 2.4 g  Avoid: Chicken, with skin, dark meat (3 oz/85 g) / 4 g  Choose: Chicken, skin removed, dark meat (3 oz/85 g) / 2 g  Avoid: Chicken, with skin, light meat (3 oz/85 g) / 2.5 g  Choose: Chicken, skin removed, light meat (3 oz/85 g) / 1 g Dairy / Saturated Fat (g)  Avoid: Whole milk (1 cup) / 5 g  Choose: Low-fat milk, 2% (1 cup) / 3 g  Choose: Low-fat milk, 1% (1 cup) / 1.5 g  Choose: Skim milk (1 cup) / 0.3 g  Avoid: Hard cheese (1 oz/28 g) / 6 g  Choose: Skim milk cheese (1 oz/28 g) / 2 to 3 g  Avoid: Cottage cheese, 4% fat (1 cup) / 6.5 g  Choose: Low-fat cottage cheese, 1% fat (1 cup) / 1.5 g  Avoid: Ice cream (1 cup) / 9 g  Choose: Sherbet (1 cup) / 2.5 g  Choose: Nonfat frozen yogurt (1 cup) / 0.3 g  Choose: Frozen fruit bar / trace  Avoid: Whipped cream (1 tbs) / 3.5 g  Choose: Nondairy whipped topping (1 tbs) / 1 g Condiments / Saturated Fat (g)  Avoid: Mayonnaise (1 tbs) / 2 g  Choose: Low-fat mayonnaise (1 tbs) / 1 g  Avoid: Butter (1 tbs) / 7 g  Choose: Extra light margarine (1 tbs) / 1 g  Avoid: Coconut oil (1 tbs) / 11.8 g  Choose: Olive oil (1 tbs) / 1.8 g  Choose: Corn oil (1 tbs) / 1.7 g  Choose: Safflower oil (1 tbs) / 1.2 g  Choose: Sunflower oil (1 tbs) / 1.4 g  Choose: Soybean oil (1 tbs) / 2.4 g  Choose: Canola oil (1 tbs) / 1 g Document Released: 08/22/2005 Document Revised: 12/17/2012 Document Reviewed: 02/10/2011 ExitCare Patient Information 2014  [place], [place].

## 2013-09-13 NOTE — Progress Notes (Signed)
Subjective:    Patient ID: Clinton Fowler, male    DOB: 21-Jul-1966, 48 y.o.   MRN: [id number]  HPI Here for health maintenance exam and to review chronic medical problems    Trying to take care of himself  Never gets a break from work   [employer] is down 2 lb with bmi of 42-- even with the holidays  Obese- he tries to cut back on portions some  Less exercise in the winter - may start walking with his wife   Flu vaccine - will get that today  Td 1/10- up to date   Prostate ca screen  Family hx of prostate cancer - father had it - thinks in his 20s  No urine flow issues and no nocturia  Lab Results  Component Value Date   PSA 0.57 09/04/2013   PSA 0.60 09/07/2012   PSA 0.85 10/03/2008    bp is stable today  No cp or palpitations or headaches or edema  No side effects to medicines  BP Readings from Last 3 Encounters:  09/13/13 128/74  02/05/13 118/76  09/07/12 124/78     Hyperlipidemia Lab Results  Component Value Date   CHOL 158 09/04/2013   CHOL 130 01/30/2013   CHOL 135 10/15/2012   Lab Results  Component Value Date   HDL 38.10* 09/04/2013   HDL 38.40* 01/30/2013   HDL 35.90* 10/15/2012   Lab Results  Component Value Date   LDLCALC 104* 09/04/2013   LDLCALC 77 01/30/2013   LDLCALC 85 10/15/2012   Lab Results  Component Value Date   TRIG 79.0 09/04/2013   TRIG 74.0 01/30/2013   TRIG 72.0 10/15/2012   Lab Results  Component Value Date   CHOLHDL 4 09/04/2013   CHOLHDL 3 01/30/2013   CHOLHDL 4 10/15/2012   Lab Results  Component Value Date   LDLDIRECT 160.7 09/07/2012   LDLDIRECT 157.4 03/06/2012   LDLDIRECT 161.6 10/09/2009   on lipitor  LDL is up - eating more cheese , not much beef-that is occasional, and occ fried food   Hx of hyperglycemia Lab Results  Component Value Date   HGBA1C 6.1 09/04/2013   up from 6.0-- overall stable     Chemistry      Component Value Date/Time   NA 138 09/04/2013 0753   K 4.4 09/04/2013 0753   CL 102 09/04/2013 0753   CO2 30  09/04/2013 0753   BUN 12 09/04/2013 0753   CREATININE 1.0 09/04/2013 0753      Component Value Date/Time   CALCIUM 8.8 09/04/2013 0753   ALKPHOS 41 09/04/2013 0753   AST 25 09/04/2013 0753   ALT 39 09/04/2013 0753   BILITOT 1.0 09/04/2013 0753      Lab Results  Component Value Date   WBC 8.6 09/04/2013   HGB 15.4 09/04/2013   HCT 45.7 09/04/2013   MCV 88.8 09/04/2013   PLT 199.0 09/04/2013    Lab Results  Component Value Date   TSH 2.44 09/04/2013      Review of Systems Review of Systems  Constitutional: Negative for fever, appetite change, fatigue and unexpected weight change.  Eyes: Negative for pain and visual disturbance.  Respiratory: Negative for cough and shortness of breath.   Cardiovascular: Negative for cp or palpitations    Gastrointestinal: Negative for nausea, diarrhea and constipation.  Genitourinary: Negative for urgency and frequency.  Skin: Negative for pallor or rash   Neurological: Negative for weakness, light-headedness, numbness and headaches.  Hematological:  Negative for adenopathy. Does not bruise/bleed easily.  Psychiatric/Behavioral: Negative for dysphoric mood. The patient is not nervous/anxious.         Objective:   Physical Exam  Constitutional: He appears well-developed and well-nourished. No distress.  obese and well appearing   HENT:  Head: Normocephalic and atraumatic.  Right Ear: External ear normal.  Left Ear: External ear normal.  Nose: Nose normal.  Mouth/Throat: Oropharynx is clear and moist.  Eyes: Conjunctivae and EOM are normal. Pupils are equal, round, and reactive to light. Right eye exhibits no discharge. Left eye exhibits no discharge. No scleral icterus.  Neck: Normal range of motion. Neck supple. No JVD present. Carotid bruit is not present. No thyromegaly present.  Cardiovascular: Normal rate, regular rhythm, normal heart sounds and intact distal pulses.  Exam reveals no gallop.   Pulmonary/Chest: Effort normal and  breath sounds normal. No respiratory distress. He has no wheezes. He exhibits no tenderness.  Abdominal: Soft. Bowel sounds are normal. He exhibits no distension, no abdominal bruit and no mass. There is no tenderness.  Musculoskeletal: He exhibits no edema and no tenderness.  Lymphadenopathy:    He has no cervical adenopathy.  Neurological: He is alert. He has normal reflexes. No cranial nerve deficit. He exhibits normal muscle tone. Coordination normal.  Skin: Skin is warm and dry. No rash noted. No erythema. No pallor.  Many stable brown nevi on back , pt sees dermatology intermittently   Psychiatric: He has a normal mood and affect.          Assessment & Plan:

## 2013-09-13 NOTE — Progress Notes (Signed)
Pre-visit discussion using our clinic review tool. No additional management support is needed unless otherwise documented below in the visit note.  

## 2013-09-14 NOTE — Assessment & Plan Note (Signed)
fam hx of prostate ca No symptoms  psa stable Will continue to follow

## 2013-09-14 NOTE — Assessment & Plan Note (Signed)
Lab Results  Component Value Date   HGBA1C 6.1 09/04/2013   Overall stable Stressed imp of low glycemic diet and wt loss to prev DM

## 2013-09-14 NOTE — Assessment & Plan Note (Signed)
Discussed how this problem influences overall health and the risks it imposes  Reviewed plan for weight loss with lower calorie diet (via better food choices and also portion control or program like weight watchers) and exercise building up to or more than 30 minutes 5 days per week including some aerobic activity    

## 2013-09-14 NOTE — Assessment & Plan Note (Signed)
Reviewed health habits including diet and exercise and skin cancer prevention Reviewed appropriate screening tests for age  Also reviewed health mt list, fam hx and immunization status , as well as social and family history   See HPI Labs rev Flu vaccine given

## 2013-09-14 NOTE — Assessment & Plan Note (Signed)
Disc goals for lipids and reasons to control them Rev labs with pt Rev low sat fat diet in detail Up with worse diet  Will work on this  Handout given

## 2013-09-14 NOTE — Assessment & Plan Note (Signed)
BP: 128/74 mmHg  bp in fair control at this time  No changes needed Disc lifstyle change with low sodium diet and exercise   Lab rev Wt loss recommended

## 2014-09-11 ENCOUNTER — Telehealth: Payer: Self-pay | Admitting: Family Medicine

## 2014-09-11 NOTE — Telephone Encounter (Signed)
Please schedule PE and refill until then  

## 2014-09-11 NOTE — Telephone Encounter (Signed)
Electronic refill request, no recent/future appt., please advise  

## 2014-09-12 NOTE — Telephone Encounter (Signed)
Patient scheduled physical on 10/28/14 and lab work the week before.  Please close note.

## 2014-09-12 NOTE — Telephone Encounter (Signed)
Med refilled.

## 2014-09-12 NOTE — Telephone Encounter (Signed)
Left message with spouse for pt to c/b and schedule annual visit

## 2014-09-17 ENCOUNTER — Other Ambulatory Visit: Payer: Self-pay | Admitting: Family Medicine

## 2014-10-20 ENCOUNTER — Telehealth: Payer: Self-pay | Admitting: Family Medicine

## 2014-10-20 DIAGNOSIS — Z0000 Encounter for general adult medical examination without abnormal findings: Secondary | ICD-10-CM

## 2014-10-20 DIAGNOSIS — R739 Hyperglycemia, unspecified: Secondary | ICD-10-CM

## 2014-10-20 DIAGNOSIS — Z125 Encounter for screening for malignant neoplasm of prostate: Secondary | ICD-10-CM

## 2014-10-20 NOTE — Telephone Encounter (Signed)
-----   Message from Terri J Walsh sent at 10/16/2014  3:17 PM EST ----- Regarding: Lab orders for Tuesday, 2.16.16 Patient is scheduled for CPX labs, please order future labs, Thanks , Terri  

## 2014-10-21 ENCOUNTER — Other Ambulatory Visit (INDEPENDENT_AMBULATORY_CARE_PROVIDER_SITE_OTHER): Payer: BLUE CROSS/BLUE SHIELD

## 2014-10-21 DIAGNOSIS — Z125 Encounter for screening for malignant neoplasm of prostate: Secondary | ICD-10-CM

## 2014-10-21 DIAGNOSIS — R739 Hyperglycemia, unspecified: Secondary | ICD-10-CM

## 2014-10-21 DIAGNOSIS — Z0000 Encounter for general adult medical examination without abnormal findings: Secondary | ICD-10-CM

## 2014-10-21 LAB — CBC WITH DIFFERENTIAL/PLATELET
Basophils Absolute: 0 10*3/uL (ref 0.0–0.1)
Basophils Relative: 0.3 % (ref 0.0–3.0)
EOS PCT: 3.3 % (ref 0.0–5.0)
Eosinophils Absolute: 0.2 10*3/uL (ref 0.0–0.7)
HCT: 44.5 % (ref 39.0–52.0)
Hemoglobin: 15.2 g/dL (ref 13.0–17.0)
Lymphocytes Relative: 33.5 % (ref 12.0–46.0)
Lymphs Abs: 2.2 10*3/uL (ref 0.7–4.0)
MCHC: 34.1 g/dL (ref 30.0–36.0)
MCV: 87.4 fl (ref 78.0–100.0)
Monocytes Absolute: 0.5 10*3/uL (ref 0.1–1.0)
Monocytes Relative: 7.7 % (ref 3.0–12.0)
NEUTROS PCT: 55.2 % (ref 43.0–77.0)
Neutro Abs: 3.7 10*3/uL (ref 1.4–7.7)
PLATELETS: 136 10*3/uL — AB (ref 150.0–400.0)
RBC: 5.09 Mil/uL (ref 4.22–5.81)
RDW: 14 % (ref 11.5–15.5)
WBC: 6.6 10*3/uL (ref 4.0–10.5)

## 2014-10-21 LAB — PSA: PSA: 0.51 ng/mL (ref 0.10–4.00)

## 2014-10-21 LAB — COMPREHENSIVE METABOLIC PANEL
ALK PHOS: 43 U/L (ref 39–117)
ALT: 35 U/L (ref 0–53)
AST: 22 U/L (ref 0–37)
Albumin: 3.8 g/dL (ref 3.5–5.2)
BUN: 11 mg/dL (ref 6–23)
CALCIUM: 9 mg/dL (ref 8.4–10.5)
CO2: 29 mEq/L (ref 19–32)
CREATININE: 1.04 mg/dL (ref 0.40–1.50)
Chloride: 104 mEq/L (ref 96–112)
GFR: 81.59 mL/min (ref >60.00)
Glucose, Bld: 138 mg/dL — ABNORMAL HIGH (ref 70–99)
Potassium: 4.2 mEq/L (ref 3.5–5.1)
Sodium: 138 mEq/L (ref 135–145)
Total Bilirubin: 0.6 mg/dL (ref 0.2–1.2)
Total Protein: 6.4 g/dL (ref 6.0–8.3)

## 2014-10-21 LAB — HEMOGLOBIN A1C: HEMOGLOBIN A1C: 5.9 % (ref 4.6–6.5)

## 2014-10-21 LAB — LIPID PANEL
CHOLESTEROL: 143 mg/dL (ref 0–200)
HDL: 37.4 mg/dL — ABNORMAL LOW (ref >39.00)
LDL Cholesterol: 84 mg/dL (ref 0–99)
NonHDL: 105.6
Total CHOL/HDL Ratio: 4
Triglycerides: 108 mg/dL (ref 0.0–149.0)
VLDL: 21.6 mg/dL (ref 0.0–40.0)

## 2014-10-21 LAB — TSH: TSH: 2.43 u[IU]/mL (ref 0.35–4.50)

## 2014-10-28 ENCOUNTER — Encounter: Payer: Self-pay | Admitting: Family Medicine

## 2014-10-28 ENCOUNTER — Ambulatory Visit (INDEPENDENT_AMBULATORY_CARE_PROVIDER_SITE_OTHER): Payer: BLUE CROSS/BLUE SHIELD | Admitting: Family Medicine

## 2014-10-28 VITALS — BP 126/72 | HR 82 | Temp 98.4°F | Ht 68.0 in | Wt 280.8 lb

## 2014-10-28 DIAGNOSIS — I10 Essential (primary) hypertension: Secondary | ICD-10-CM

## 2014-10-28 DIAGNOSIS — E669 Obesity, unspecified: Secondary | ICD-10-CM

## 2014-10-28 DIAGNOSIS — E785 Hyperlipidemia, unspecified: Secondary | ICD-10-CM

## 2014-10-28 DIAGNOSIS — Z23 Encounter for immunization: Secondary | ICD-10-CM

## 2014-10-28 DIAGNOSIS — R739 Hyperglycemia, unspecified: Secondary | ICD-10-CM

## 2014-10-28 DIAGNOSIS — Z0000 Encounter for general adult medical examination without abnormal findings: Secondary | ICD-10-CM

## 2014-10-28 DIAGNOSIS — Z125 Encounter for screening for malignant neoplasm of prostate: Secondary | ICD-10-CM

## 2014-10-28 MED ORDER — OMEPRAZOLE 20 MG PO CPDR: DELAYED_RELEASE_CAPSULE | ORAL | Status: DC

## 2014-10-28 MED ORDER — LISINOPRIL 10 MG PO TABS: ORAL_TABLET | ORAL | Status: DC

## 2014-10-28 MED ORDER — ATORVASTATIN CALCIUM 20 MG PO TABS: 20.0000 mg | ORAL_TABLET | Freq: Every day | ORAL | Status: DC

## 2014-10-28 NOTE — Assessment & Plan Note (Signed)
Reviewed health habits including diet and exercise and skin cancer prevention Reviewed appropriate screening tests for age  Also reviewed health mt list, fam hx and immunization status , as well as social and family history   See HPI Flu shot today Labs reviewed  Enc wt loss

## 2014-10-28 NOTE — Assessment & Plan Note (Signed)
Lab Results  Component Value Date   HGBA1C 5.9 10/21/2014   This is slt improved Disc low glycemic diet to prevent DM Will stop soda and sweet tea and work on wt loss

## 2014-10-28 NOTE — Progress Notes (Signed)
Subjective:    Patient ID: Clinton Fowler, male    DOB: 06/24/1966, 49 y.o.   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  Diet is "about the same"  Not getting exercise - but plans to start back working in march    Flu vaccine-will do that today   Td 1/10  Glucose 130 Lab Results  Component Value Date   HGBA1C 5.9 10/21/2014   This is down from 6.1 Does not eat a lot of sweets  He drinks 2 sodas and sweet tea at night  Knows he needs to quit that   Hyperlipidemia  lipitor and diet Lab Results  Component Value Date   CHOL 143 10/21/2014   CHOL 158 09/04/2013   CHOL 130 01/30/2013   Lab Results  Component Value Date   HDL 37.40* 10/21/2014   HDL 38.10* 09/04/2013   HDL 38.40* 01/30/2013   Lab Results  Component Value Date   LDLCALC 84 10/21/2014   LDLCALC 104* 09/04/2013   LDLCALC 77 01/30/2013   Lab Results  Component Value Date   TRIG 108.0 10/21/2014   TRIG 79.0 09/04/2013   TRIG 74.0 01/30/2013   Lab Results  Component Value Date   CHOLHDL 4 10/21/2014   CHOLHDL 4 09/04/2013   CHOLHDL 3 01/30/2013   Lab Results  Component Value Date   LDLDIRECT 160.7 09/07/2012   LDLDIRECT 157.4 03/06/2012   LDLDIRECT 161.6 10/09/2009  stable overall  Needs to raise HDL  He eats shrimp/not fish with fins as often     bp is stable today  No cp or palpitations or headaches or edema  No side effects to medicines  BP Readings from Last 3 Encounters:  10/28/14 126/72  09/13/13 128/74  02/05/13 118/76    On lisinopril   Lab Results  Component Value Date   WBC 6.6 10/21/2014   HGB 15.2 10/21/2014   HCT 44.5 10/21/2014   MCV 87.4  10/21/2014   PLT 136.0* 10/21/2014     Chemistry      Component Value Date/Time   NA 138 10/21/2014 0815   K 4.2 10/21/2014 0815   CL 104 10/21/2014 0815   CO2 29 10/21/2014 0815   BUN 11 10/21/2014 0815   CREATININE 1.04 10/21/2014 0815      Component Value Date/Time   CALCIUM 9.0 10/21/2014 0815   ALKPHOS 43 10/21/2014 0815   AST 22 10/21/2014 0815   ALT 35 10/21/2014 0815   BILITOT 0.6 10/21/2014 0815       Prostate screen Father had prostate ca Lab Results  Component Value Date   PSA 0.51 10/21/2014   PSA 0.57 09/04/2013   PSA 0.60 09/07/2012    Stays low - excellent  No nocturia  No slowed stream or other symptoms  Father had prostate cancer - in late life    Patient Active Problem List   Diagnosis Date Noted  . Routine general medical examination at a health care facility 09/07/2012  . Hyperglycemia 09/07/2012  . Prostate cancer screening 09/07/2012  . Obese  03/06/2012  . TRANSAMINASES, SERUM, ELEVATED 11/10/2009  . ESSENTIAL HYPERTENSION, BENIGN 10/09/2009  . Hyperlipidemia 10/02/2008   Past Medical History  Diagnosis Date  . History of gastroesophageal reflux (GERD)   . History of hyperlipidemia   . Obesity   . Biceps tendon rupture     history of biceps tendon rupture bilaterally  . History of hypertension   . Abnormal transaminases     elevated transaminases (likely fatty liver)   Past Surgical History  Procedure Laterality Date  . Biceps tendon repair      ruptured biceps   History  Substance Use Topics  . Smoking status: Former Research scientist (life sciences)  . Smokeless tobacco: Not on file     Comment: quit over 15 years ago  . Alcohol Use: No   Family History  Problem Relation Age of Onset  . Hypertension Father   . Prostate cancer Father   . Cancer - Other Mother     mouth cancer  . Coronary artery disease Other     Grandmother  . Coronary artery disease Other     Grandfather   No Known Allergies Current Outpatient Prescriptions on File Prior  to Visit  Medication Sig Dispense Refill  . atorvastatin (LIPITOR) 20 MG tablet TAKE 1 TABLET BY MOUTH DAILY 30 tablet 1  . lisinopril (PRINIVIL,ZESTRIL) 10 MG tablet TAKE 1 TABLET (10 MG TOTAL) BY MOUTH DAILY. 30 tablet 1  . omeprazole (PRILOSEC) 20 MG capsule TAKE 1 CAPSULE (20 MG TOTAL) BY MOUTH DAILY. 30 capsule 1   No current facility-administered medications on file prior to visit.     Review of Systems Review of Systems  Constitutional: Negative for fever, appetite change, fatigue and unexpected weight change.  Eyes: Negative for pain and visual disturbance.  Respiratory: Negative for cough and shortness of breath.   Cardiovascular: Negative for cp or palpitations    Gastrointestinal: Negative for nausea, diarrhea and constipation.  Genitourinary: Negative for urgency and frequency.  Skin: Negative for pallor or rash   MSK pos for neck tension /spasm on R Neurological: Negative for weakness, light-headedness, numbness and headaches.  Hematological: Negative for adenopathy. Does not bruise/bleed easily.  Psychiatric/Behavioral: Negative for dysphoric mood. The patient is not nervous/anxious.         Objective:   Physical Exam  Constitutional: He appears well-developed and well-nourished. No distress.  obese and well appearing   HENT:  Head: Normocephalic and atraumatic.  Right Ear: External ear normal.  Left Ear: External ear normal.  Nose: Nose normal.  Mouth/Throat: Oropharynx is clear and moist.  Eyes: Conjunctivae and EOM are normal. Pupils are equal, round, and reactive to light. Right eye exhibits no discharge. Left eye exhibits no discharge. No scleral icterus.  Neck: Normal range of motion. Neck supple. No JVD present. Carotid bruit is not present. No thyromegaly present.  Tends cervical musculature and trapezius on R with nl rom and no bony tenderness  Cardiovascular: Normal rate, regular rhythm, normal heart sounds and intact distal pulses.  Exam reveals no  gallop.   Pulmonary/Chest: Effort normal and breath sounds normal. No respiratory distress. He has no wheezes. He exhibits no tenderness.  Abdominal: Soft. Bowel sounds are normal. He exhibits no distension, no abdominal bruit and no mass. There is no tenderness.  Musculoskeletal: He exhibits no edema or tenderness.  Lymphadenopathy:    He has no cervical adenopathy.  Neurological: He is alert. He has normal reflexes. No cranial nerve deficit. He exhibits normal muscle tone. Coordination normal.  Skin: Skin is warm and dry. No rash noted. No erythema. No pallor.  Ruddy complexion   Psychiatric: He has a normal mood and affect.          Assessment & Plan:   Problem List Items Addressed This Visit      Cardiovascular and Mediastinum   ESSENTIAL HYPERTENSION, BENIGN - Primary    bp in fair control at this time  BP Readings from Last 1 Encounters:  10/28/14 126/72   No changes needed Disc lifstyle change with low sodium diet and exercise  Labs reviewed  Medicines refilled -is compliant Enc to loose wt       Relevant Medications   atorvastatin (LIPITOR) tablet   lisinopril (PRINIVIL,ZESTRIL) tablet     Other   Hyperglycemia    Lab Results  Component Value Date   HGBA1C 5.9 10/21/2014   This is slt improved Disc low glycemic diet to prevent DM Will stop soda and sweet tea and work on wt loss       Hyperlipidemia    Disc goals for lipids and reasons to control them Rev labs with pt Rev low sat fat diet in detail Enc exercise and omega 3 to help raise HDL      Relevant Medications   atorvastatin (LIPITOR) tablet   lisinopril (PRINIVIL,ZESTRIL) tablet   Obese    Discussed how this problem influences overall health and the risks it imposes  Reviewed plan for weight loss with lower calorie diet (via better food choices and also portion control or program like weight watchers) and exercise building up to or more than 30 minutes 5 days per week including some aerobic  activity   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  Disc plan to cut portions  Disc wt gain - metabolism may be dec with age       Prostate cancer screening    Lab Results  Component Value Date   PSA 0.51 10/21/2014   PSA 0.57 09/04/2013   PSA 0.60 09/07/2012   Re assuring No symptoms Pos fam hx (father in old age)  Def exam today due to stable psa and no symptoms   Disc what to watch for  Will continue to follow       Routine general medical examination at a health care facility    Reviewed health habits including diet and exercise and skin cancer prevention Reviewed appropriate screening tests for age  Also reviewed health mt list, fam hx and immunization status , as well as social and family history   See HPI Flu shot today Labs reviewed  Enc wt loss        Other Visit Diagnoses    Flu vaccine need        Relevant Orders    Flu Vaccine QUAD 36+ mos IM (Completed)

## 2014-10-28 NOTE — Progress Notes (Signed)
Pre visit review using our clinic review tool, if applicable. No additional management support is needed unless otherwise documented below in the visit note. 

## 2014-10-28 NOTE — Assessment & Plan Note (Signed)
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

## 2014-10-28 NOTE — Assessment & Plan Note (Signed)
Disc goals for lipids and reasons to control them Rev labs with pt Rev low sat fat diet in detail Enc exercise and omega 3 to help raise HDL

## 2014-10-28 NOTE — Patient Instructions (Signed)
Take care of yourself  Work on weight loss - blood sugar is stable but we want to decrease diabetes risk  Drink more water and stop sugar drinks  Get more active again    I sent medicines to the pharmacy   Flu shot today

## 2014-10-28 NOTE — Assessment & Plan Note (Signed)
Lab Results  Component Value Date   PSA 0.51 10/21/2014   PSA 0.57 09/04/2013   PSA 0.60 09/07/2012   Re assuring No symptoms Pos fam hx (father in old age)  Def exam today due to stable psa and no symptoms   Disc what to watch for  Will continue to follow

## 2014-10-28 NOTE — Assessment & Plan Note (Signed)
bp in fair control at this time  BP Readings from Last 1 Encounters:  10/28/14 126/72   No changes needed Disc lifstyle change with low sodium diet and exercise  Labs reviewed  Medicines refilled -is compliant Enc to loose wt

## 2014-10-29 ENCOUNTER — Telehealth: Payer: Self-pay | Admitting: Family Medicine

## 2014-10-29 NOTE — Telephone Encounter (Signed)
emmi emailed °

## 2015-08-28 ENCOUNTER — Encounter: Payer: Self-pay | Admitting: Family Medicine

## 2015-08-28 ENCOUNTER — Ambulatory Visit (INDEPENDENT_AMBULATORY_CARE_PROVIDER_SITE_OTHER): Payer: BLUE CROSS/BLUE SHIELD | Admitting: Family Medicine

## 2015-08-28 ENCOUNTER — Ambulatory Visit
Admission: RE | Admit: 2015-08-28 | Discharge: 2015-08-28 | Disposition: A | Discharge status: Home / Self Care | Payer: BLUE CROSS/BLUE SHIELD | Source: Ambulatory Visit | Attending: Family Medicine | Admitting: Family Medicine

## 2015-08-28 ENCOUNTER — Telehealth: Payer: Self-pay | Admitting: Family Medicine

## 2015-08-28 VITALS — BP 110/66 | HR 88 | Temp 98.2°F | Ht 68.0 in

## 2015-08-28 DIAGNOSIS — N50811 Right testicular pain: Secondary | ICD-10-CM

## 2015-08-28 DIAGNOSIS — N433 Hydrocele, unspecified: Secondary | ICD-10-CM | POA: Diagnosis not present

## 2015-08-28 NOTE — Patient Instructions (Signed)
Stop at check out for referral for testicular ultrasound  If pain suddenly worsens -update [place] or seek care in ER

## 2015-08-28 NOTE — Telephone Encounter (Signed)
Call report received from [facility] and read as follows, No evidence of torsion. Negative study aside from a small mildy complex right sided hydrocele. Report given to Dr Glori Bickers. She will contact patient today by phone.

## 2015-08-28 NOTE — Telephone Encounter (Signed)
Ref done Will route to [place] 

## 2015-08-28 NOTE — Telephone Encounter (Signed)
Pt notified of [place] results and Dr. Marliss Coots instructions/ recommendations. Mailed pt handout on hydrocele. Pt does want to see urologist, they would like to go to [facility] and see Dr. Tammi Klippel if possible, please put referral in I advise pt and wife that our [facility] will call to schedule appt.

## 2015-08-28 NOTE — Progress Notes (Signed)
Pre visit review using our clinic review tool, if applicable. No additional management support is needed unless otherwise documented below in the visit note. 

## 2015-08-28 NOTE — Telephone Encounter (Signed)
Ultrasound shows a hydrocele - a collection of fluid in the scrotal sac around the testicle -not uncommon  Sometimes these have to be treated surgically -not always Try not to put too much pressure on the area but do wear supportive undergarments Please mail handout on this to him I want to refer him to urology for further evaluation if agreeable-please let me know

## 2015-08-28 NOTE — Progress Notes (Signed)
Subjective:    Patient ID: Clinton Fowler, male    DOB: [birth year], 49 y.o.   MRN: [id number]  HPI R testicle is uncomfortable and perhaps a little swollen (right side)  It is retracted more than the other one  No trauma at all  No bulge that he can see   Started about a week ago- not getting better  Thought it was his underwear or long johns too tight  He tried just the long johns  2 nights- slept without anything on -helps a bit   No redness   Last week- he was standing a lot - back pain -for 2 days (all over)   Also some constipation over the last wk - had 2 d w/o a bm  Finally went - had to strain a lot  The next day it was a normal bm  No blood in stool   No urinary symptoms  ? If he drinks enough water   Patient Active Problem List   Diagnosis Date Noted  . Routine general medical examination at a health care facility 09/07/2012  . Hyperglycemia 09/07/2012  . Prostate cancer screening 09/07/2012  . Obese 03/06/2012  . TRANSAMINASES, SERUM, ELEVATED 11/10/2009  . ESSENTIAL HYPERTENSION, BENIGN 10/09/2009  . Hyperlipidemia 10/02/2008   Past Medical History  Diagnosis Date  . History of gastroesophageal reflux (GERD)   . History of hyperlipidemia   . Obesity   . Biceps tendon rupture     history of biceps tendon rupture bilaterally  . History of hypertension   . Abnormal transaminases     elevated transaminases (likely fatty liver)   Past Surgical History  Procedure Laterality Date  . Biceps tendon repair      ruptured biceps   Social History  Substance Use Topics  . Smoking status: Former Research scientist (life sciences)  . Smokeless tobacco: None     Comment: quit over 15 years ago  . Alcohol Use: No   Family History  Problem Relation Age of Onset  . Hypertension Father   . Prostate cancer Father   . Cancer - Other Mother     mouth cancer  . Coronary artery disease Other     Grandmother  . Coronary artery disease Other     Grandfather   No Known  Allergies Current Outpatient Prescriptions on File Prior to Visit  Medication Sig Dispense Refill  . atorvastatin (LIPITOR) 20 MG tablet Take 1 tablet (20 mg total) by mouth daily. 30 tablet 11  . lisinopril (PRINIVIL,ZESTRIL) 10 MG tablet TAKE 1 TABLET (10 MG TOTAL) BY MOUTH DAILY. 30 tablet 11  . omeprazole (PRILOSEC) 20 MG capsule TAKE 1 CAPSULE (20 MG TOTAL) BY MOUTH DAILY. 30 capsule 11   No current facility-administered medications on file prior to visit.     Review of Systems     Objective:   Physical Exam  Constitutional: He appears well-developed and well-nourished.  obese and well appearing   HENT:  Head: Normocephalic and atraumatic.  Eyes: Conjunctivae and EOM are normal. Pupils are equal, round, and reactive to light.  Neck: Normal range of motion. Neck supple.  Cardiovascular: Normal rate and regular rhythm.   Genitourinary: Cremasteric reflex is present. Right testis shows swelling and tenderness. Right testis shows no mass. Cremasteric reflex is not absent on the right side. Left testis shows no mass, no swelling and no tenderness. Left testis is descended. Cremasteric reflex is not absent on the left side.  R testicle is tender to the touch -  very slt swollen and higher than the other testicle No obvious hernia detected No warmth or redness  Neurological: He is alert.  Skin: Skin is warm and dry. No rash noted. No pallor.  Psychiatric: He has a normal mood and affect.          Assessment & Plan:   Problem List Items Addressed This Visit      Other   Pain in right testicle - Primary    With some tenderness on exam No s/s of kidney stone  fam hx of testicular torsion  Sent for testicular/scrotal ultrasound now       Relevant Orders   US Scrotum (Completed)   [place] Art/Ven Flow Abd Pelv Doppler (Completed)

## 2015-08-28 NOTE — Telephone Encounter (Signed)
Referral faxed to [facility]/waiting for appt to be made -arc

## 2015-08-31 NOTE — Assessment & Plan Note (Signed)
With some tenderness on exam No s/s of kidney stone  fam hx of testicular torsion  Sent for testicular/scrotal ultrasound now

## 2015-11-14 ENCOUNTER — Other Ambulatory Visit: Payer: Self-pay | Admitting: Family Medicine

## 2016-03-13 ENCOUNTER — Other Ambulatory Visit: Payer: Self-pay | Admitting: Family Medicine

## 2016-03-14 NOTE — Telephone Encounter (Signed)
No recent/future appts., please advise  

## 2016-03-14 NOTE — Telephone Encounter (Signed)
Please schedule PE for fall or winter and refill until then, thanks

## 2016-03-15 NOTE — Telephone Encounter (Signed)
Left voicemail requesting pt to call office and schedule CPE before med can be filled

## 2016-03-15 NOTE — Telephone Encounter (Signed)
Pt left v/m requesting cb about med refills.pt has 3 pills left; did not leave name of med.

## 2016-03-16 NOTE — Telephone Encounter (Signed)
meds filled

## 2016-03-16 NOTE — Telephone Encounter (Signed)
Patient scheduled labs and physical in December.

## 2016-07-11 ENCOUNTER — Other Ambulatory Visit: Payer: Self-pay | Admitting: Family Medicine

## 2016-08-07 ENCOUNTER — Telehealth: Payer: Self-pay | Admitting: Family Medicine

## 2016-08-07 DIAGNOSIS — R739 Hyperglycemia, unspecified: Secondary | ICD-10-CM

## 2016-08-07 DIAGNOSIS — Z125 Encounter for screening for malignant neoplasm of prostate: Secondary | ICD-10-CM

## 2016-08-07 DIAGNOSIS — Z0000 Encounter for general adult medical examination without abnormal findings: Secondary | ICD-10-CM

## 2016-08-07 NOTE — Telephone Encounter (Signed)
-----   Message from Ellamae Sia sent at 08/04/2016  9:35 AM EST ----- Regarding: Lab orders for Thursday, 12.7.17 Patient is scheduled for CPX labs, please order future labs, Thanks , Karna Christmas

## 2016-08-11 ENCOUNTER — Other Ambulatory Visit (INDEPENDENT_AMBULATORY_CARE_PROVIDER_SITE_OTHER): Payer: BLUE CROSS/BLUE SHIELD

## 2016-08-11 DIAGNOSIS — Z125 Encounter for screening for malignant neoplasm of prostate: Secondary | ICD-10-CM

## 2016-08-11 DIAGNOSIS — R739 Hyperglycemia, unspecified: Secondary | ICD-10-CM

## 2016-08-11 DIAGNOSIS — Z0000 Encounter for general adult medical examination without abnormal findings: Secondary | ICD-10-CM | POA: Diagnosis not present

## 2016-08-11 LAB — COMPREHENSIVE METABOLIC PANEL
ALBUMIN: 4.2 g/dL (ref 3.5–5.2)
ALT: 31 U/L (ref 0–53)
AST: 20 U/L (ref 0–37)
Alkaline Phosphatase: 50 U/L (ref 39–117)
BUN: 17 mg/dL (ref 6–23)
CHLORIDE: 104 meq/L (ref 96–112)
CO2: 29 mEq/L (ref 19–32)
CREATININE: 1.08 mg/dL (ref 0.40–1.50)
Calcium: 8.9 mg/dL (ref 8.4–10.5)
GFR: 77.51 mL/min (ref >60.00)
GLUCOSE: 108 mg/dL — AB (ref 70–99)
Potassium: 4.3 mEq/L (ref 3.5–5.1)
SODIUM: 141 meq/L (ref 135–145)
TOTAL PROTEIN: 6.7 g/dL (ref 6.0–8.3)
Total Bilirubin: 0.5 mg/dL (ref 0.2–1.2)

## 2016-08-11 LAB — CBC WITH DIFFERENTIAL/PLATELET
Basophils Absolute: 0 10*3/uL (ref 0.0–0.1)
Basophils Relative: 0.2 % (ref 0.0–3.0)
EOS ABS: 0.2 10*3/uL (ref 0.0–0.7)
Eosinophils Relative: 2.1 % (ref 0.0–5.0)
HCT: 43.6 % (ref 39.0–52.0)
HEMOGLOBIN: 14.8 g/dL (ref 13.0–17.0)
LYMPHS ABS: 2.9 10*3/uL (ref 0.7–4.0)
Lymphocytes Relative: 32 % (ref 12.0–46.0)
MCHC: 33.9 g/dL (ref 30.0–36.0)
MCV: 87 fl (ref 78.0–100.0)
MONO ABS: 0.7 10*3/uL (ref 0.1–1.0)
Monocytes Relative: 7.5 % (ref 3.0–12.0)
NEUTROS PCT: 58.2 % (ref 43.0–77.0)
Neutro Abs: 5.2 10*3/uL (ref 1.4–7.7)
Platelets: 208 10*3/uL (ref 150.0–400.0)
RBC: 5.01 Mil/uL (ref 4.22–5.81)
RDW: 13.7 % (ref 11.5–15.5)
WBC: 8.9 10*3/uL (ref 4.0–10.5)

## 2016-08-11 LAB — LIPID PANEL
CHOLESTEROL: 154 mg/dL (ref 0–200)
HDL: 38.8 mg/dL — ABNORMAL LOW (ref >39.00)
LDL CALC: 96 mg/dL (ref 0–99)
NONHDL: 115.11
Total CHOL/HDL Ratio: 4
Triglycerides: 96 mg/dL (ref 0.0–149.0)
VLDL: 19.2 mg/dL (ref 0.0–40.0)

## 2016-08-11 LAB — PSA: PSA: 0.71 ng/mL (ref 0.10–4.00)

## 2016-08-11 LAB — HEMOGLOBIN A1C: Hgb A1c MFr Bld: 6 % (ref 4.6–6.5)

## 2016-08-11 LAB — TSH: TSH: 2.36 u[IU]/mL (ref 0.35–4.50)

## 2016-08-19 ENCOUNTER — Ambulatory Visit (INDEPENDENT_AMBULATORY_CARE_PROVIDER_SITE_OTHER): Payer: BLUE CROSS/BLUE SHIELD | Admitting: Family Medicine

## 2016-08-19 ENCOUNTER — Encounter: Payer: Self-pay | Admitting: Family Medicine

## 2016-08-19 VITALS — BP 126/68 | HR 78 | Temp 98.1°F | Ht 67.0 in | Wt 275.5 lb

## 2016-08-19 DIAGNOSIS — Z23 Encounter for immunization: Secondary | ICD-10-CM

## 2016-08-19 DIAGNOSIS — Z0000 Encounter for general adult medical examination without abnormal findings: Secondary | ICD-10-CM | POA: Diagnosis not present

## 2016-08-19 DIAGNOSIS — E7800 Pure hypercholesterolemia, unspecified: Secondary | ICD-10-CM

## 2016-08-19 DIAGNOSIS — I10 Essential (primary) hypertension: Secondary | ICD-10-CM | POA: Diagnosis not present

## 2016-08-19 DIAGNOSIS — Z125 Encounter for screening for malignant neoplasm of prostate: Secondary | ICD-10-CM

## 2016-08-19 DIAGNOSIS — R739 Hyperglycemia, unspecified: Secondary | ICD-10-CM

## 2016-08-19 MED ORDER — OMEPRAZOLE 20 MG PO CPDR: DELAYED_RELEASE_CAPSULE | ORAL | 11 refills | Status: DC

## 2016-08-19 MED ORDER — LISINOPRIL 10 MG PO TABS: ORAL_TABLET | ORAL | 11 refills | Status: DC

## 2016-08-19 MED ORDER — ATORVASTATIN CALCIUM 20 MG PO TABS: ORAL_TABLET | ORAL | 11 refills | Status: DC

## 2016-08-19 NOTE — Assessment & Plan Note (Signed)
Lab Results  Component Value Date   HGBA1C 6.0 08/11/2016   This is overall stable Disc need for wt loss and low glycemic diet to prevent DM

## 2016-08-19 NOTE — Progress Notes (Signed)
Pre visit review using our clinic review tool, if applicable. No additional management support is needed unless otherwise documented below in the visit note. 

## 2016-08-19 NOTE — Progress Notes (Signed)
Subjective:    Patient ID: Clinton Fowler, male    DOB: Nov 09, 1965, 50 y.o.   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  bmi is 43.1  HIV  screen -not interested   Flu shot -today  Tetanus 1/10  Prostate screen Lab Results  Component Value Date   PSA 0.71 08/11/2016   PSA 0.51 10/21/2014   PSA 0.57 09/04/2013    Father had prostate cancer - very late in life  No nocturia  No problems emptying bladder  Urinates more often when it is cold - does not bother him at all    bp is stable today  No cp or palpitations or headaches or edema  No side effects to medicines  BP Readings from Last 3 Encounters:  08/19/16 126/68  08/28/15 110/66  10/28/14 126/72    On lisinopril-no problems   Hx of hyperlipidemia Lab Results  Component Value Date   CHOL 154 08/11/2016   CHOL 143 10/21/2014   CHOL 158 09/04/2013   Lab Results  Component Value Date   HDL 38.80 (L) 08/11/2016   HDL 37.40 (L) 10/21/2014   HDL 38.10 (L) 09/04/2013   Lab Results  Component Value Date   LDLCALC 96 08/11/2016   LDLCALC 84 10/21/2014   LDLCALC 104 (H) 09/04/2013   Lab Results  Component Value Date   TRIG 96.0 08/11/2016   TRIG 108.0 10/21/2014   TRIG 79.0 09/04/2013   Lab Results  Component Value Date   CHOLHDL 4 08/11/2016   CHOLHDL 4 10/21/2014   CHOLHDL 4 09/04/2013   Lab Results  Component Value Date   LDLDIRECT 160.7 09/07/2012   LDLDIRECT 157.4 03/06/2012   LDLDIRECT 161.6 10/09/2009   Atorvastatin and diet = overall stable  HDL is still not at goal  Does not eat a lot of fish , and does a lot  of physical work  Does eat a fair amt of beef   Hx of hyperglycemia Lab Results  Component Value Date   HGBA1C 6.0 08/11/2016  tries to stay away from sugar drinks  No juice as a rule     Chemistry      Component Value Date/Time   NA 141 08/11/2016 0841   K 4.3 08/11/2016 0841   CL 104 08/11/2016 0841   CO2 29 08/11/2016 0841   BUN 17 08/11/2016 0841   CREATININE 1.08 08/11/2016 0841      Component Value Date/Time   CALCIUM 8.9 08/11/2016 0841   ALKPHOS 50 08/11/2016 0841   AST 20 08/11/2016 0841   ALT 31 08/11/2016 0841   BILITOT 0.5 08/11/2016 0841     glucose 108  Lab Results  Component Value Date   WBC 8.9 08/11/2016   HGB 14.8 08/11/2016   HCT 43.6 08/11/2016   MCV 87.0 08/11/2016   PLT 208.0 08/11/2016    Lab Results  Component Value Date   TSH 2.36 08/11/2016    Patient Active Problem List   Diagnosis Date Noted  .  Pain in right testicle 08/28/2015  . Hydrocele in adult 08/28/2015  . Routine general medical examination at a health care facility 09/07/2012  . Hyperglycemia 09/07/2012  . Prostate cancer screening 09/07/2012  . Morbid obesity ([place]) 03/06/2012  . ESSENTIAL HYPERTENSION, BENIGN 10/09/2009  . Hyperlipidemia 10/02/2008   Past Medical History:  Diagnosis Date  . Abnormal transaminases    elevated transaminases (likely fatty liver)  . Biceps tendon rupture    history of biceps tendon rupture bilaterally  . History of gastroesophageal reflux (GERD)   . History of hyperlipidemia   . History of hypertension   . Obesity    Past Surgical History:  Procedure Laterality Date  . BICEPS TENDON REPAIR     ruptured biceps   Social History  Substance Use Topics  . Smoking status: Former Research scientist (life sciences)  . Smokeless tobacco: Never Used  . Alcohol use No   Family History  Problem Relation Age of Onset  . Hypertension Father   . Prostate cancer Father   . Cancer - Other Mother     mouth cancer  . Coronary artery disease Other     Grandmother  .  Coronary artery disease Other     Grandfather   No Known Allergies No current outpatient prescriptions on file prior to visit.   No current facility-administered medications on file prior to visit.     Review of Systems Review of Systems  Constitutional: Negative for fever, appetite change, and unexpected weight change. pos for occ fatigue from schedule/farming  Eyes: Negative for pain and visual disturbance.  Respiratory: Negative for cough and shortness of breath.   Cardiovascular: Negative for cp or palpitations    Gastrointestinal: Negative for nausea, diarrhea and constipation.  Genitourinary: Negative for urgency and frequency.  Skin: Negative for pallor or rash  pos for cyst on back that is not bothersome Neurological: Negative for weakness, light-headedness, numbness and headaches.  Hematological: Negative for adenopathy. Does not bruise/bleed easily.  Psychiatric/Behavioral: Negative for dysphoric mood. The patient is not nervous/anxious.         Objective:   Physical Exam  Constitutional: He appears well-developed and well-nourished. No distress.  Well appearing , obese  HENT:  Head: Normocephalic and atraumatic.  Right Ear: External ear normal.  Left Ear: External ear normal.  Nose: Nose normal.  Mouth/Throat: Oropharynx is clear and moist.  Eyes: Conjunctivae and EOM are normal. Pupils are equal, round, and reactive to light. Right eye exhibits no discharge. Left eye exhibits no discharge. No scleral icterus.  Neck: Normal range of motion. Neck supple. No JVD present. Carotid bruit is not present. No thyromegaly present.  Cardiovascular: Normal rate, regular rhythm, normal heart sounds and intact distal pulses.  Exam reveals no gallop.   Pulmonary/Chest: Effort normal and breath sounds normal. No respiratory distress. He has no wheezes. He exhibits no tenderness.  Abdominal: Soft. Bowel sounds are normal. He exhibits no distension, no abdominal bruit and no mass.  There is no tenderness.  Musculoskeletal: He exhibits no edema or tenderness.  Lymphadenopathy:    He has no cervical adenopathy.  Neurological: He is alert. He has normal reflexes. No cranial nerve deficit. He exhibits normal muscle tone. Coordination normal.  Skin: Skin is warm and dry. No rash noted. No erythema. No pallor.  Ruddy complexion   Some brown nevi on back and skin tags  1 cm seb cyst L back-non inflamed or infected and nt   Psychiatric: He has a normal mood and affect.  Assessment & Plan:   Problem List Items Addressed This Visit      Cardiovascular and Mediastinum   ESSENTIAL HYPERTENSION, BENIGN    bp in fair control at this time  BP Readings from Last 1 Encounters:  08/19/16 126/68   No changes needed Disc lifstyle change with low sodium diet and exercise  Labs reviewed Wt loss enc      Relevant Medications   lisinopril (PRINIVIL,ZESTRIL) 10 MG tablet   atorvastatin (LIPITOR) 20 MG tablet     Other   Routine general medical examination at a health care facility    Reviewed health habits including diet and exercise and skin cancer prevention Reviewed appropriate screening tests for age  Also reviewed health mt list, fam hx and immunization status , as well as social and family history   See HPI Rev diet and need for wt loss  Rev low trans/sat fat diet Rev low glycemic diet  Flu shot today  Disc sun protection to prevent skin cancer       Prostate cancer screening    Lab Results  Component Value Date   PSA 0.71 08/11/2016   PSA 0.51 10/21/2014   PSA 0.57 09/04/2013   No BPH symptoms  Father had prostate cancer in old age  Will continue to follow       Morbid obesity ([place])    Discussed how this problem influences overall health and the risks it imposes  Reviewed plan for weight loss with lower calorie diet (via better food choices and also portion control or program like weight watchers) and exercise building up to or more than  30 minutes 5 days per week including some aerobic activity   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 by 1/4 - he has already lost 5 lb  Also avoid too many simple/refined carbs and potatoes      Hyperlipidemia    Disc goals for lipids and reasons to control them Rev labs with pt Rev low sat fat diet in detail Diet disc-will try to cut out beef as much as possible and stop frying chicken  Inc green veggies Continue atorvastatin       Relevant Medications   lisinopril (PRINIVIL,ZESTRIL) 10 MG tablet   atorvastatin (LIPITOR) 20 MG tablet   Hyperglycemia    Lab Results  Component Value Date   HGBA1C 6.0 08/11/2016   This is overall stable Disc need for wt loss and low glycemic diet to prevent DM       Other Visit Diagnoses    Need for influenza vaccination    -  Primary   Relevant Orders   Flu Vaccine QUAD 36+ mos IM (Completed)

## 2016-08-19 NOTE — Assessment & Plan Note (Signed)
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

## 2016-08-19 NOTE — Patient Instructions (Addendum)
For cholesterol    Avoid red meat/ fried foods/ egg yolks/ fatty breakfast meats/ butter, cheese and high fat dairy/ and shellfish    You still have borderline/ pre diabetes : work on weight loss and also eat less refined carbohydrates/sugar/ junk food  Also avoid sugar drinks Cut portions by 1/4 to keep loosing weight and stay active   Flu shot today

## 2016-08-19 NOTE — Assessment & Plan Note (Signed)
Disc goals for lipids and reasons to control them Rev labs with pt Rev low sat fat diet in detail Diet disc-will try to cut out beef as much as possible and stop frying chicken  Inc green veggies Continue atorvastatin

## 2016-08-19 NOTE — Assessment & Plan Note (Signed)
bp in fair control at this time  BP Readings from Last 1 Encounters:  08/19/16 126/68   No changes needed Disc lifstyle change with low sodium diet and exercise  Labs reviewed Wt loss enc

## 2016-08-19 NOTE — Assessment & Plan Note (Signed)
Lab Results  Component Value Date   PSA 0.71 08/11/2016   PSA 0.51 10/21/2014   PSA 0.57 09/04/2013   No BPH symptoms  Father had prostate cancer in old age  Will continue to follow

## 2016-08-19 NOTE — Assessment & Plan Note (Signed)
Reviewed health habits including diet and exercise and skin cancer prevention Reviewed appropriate screening tests for age  Also reviewed health mt list, fam hx and immunization status , as well as social and family history   See HPI Rev diet and need for wt loss  Rev low trans/sat fat diet Rev low glycemic diet  Flu shot today  Disc sun protection to prevent skin cancer

## 2016-08-22 ENCOUNTER — Ambulatory Visit (INDEPENDENT_AMBULATORY_CARE_PROVIDER_SITE_OTHER): Payer: BLUE CROSS/BLUE SHIELD | Admitting: Family Medicine

## 2016-08-22 ENCOUNTER — Encounter: Payer: Self-pay | Admitting: Family Medicine

## 2016-08-22 VITALS — BP 114/76 | HR 80 | Temp 98.2°F | Ht 68.0 in | Wt 278.5 lb

## 2016-08-22 DIAGNOSIS — M7541 Impingement syndrome of right shoulder: Secondary | ICD-10-CM | POA: Diagnosis not present

## 2016-08-22 DIAGNOSIS — M25561 Pain in right knee: Secondary | ICD-10-CM

## 2016-08-22 DIAGNOSIS — M7551 Bursitis of right shoulder: Secondary | ICD-10-CM | POA: Diagnosis not present

## 2016-08-22 MED ORDER — METHYLPREDNISOLONE ACETATE 40 MG/ML IJ SUSP
80.0000 mg | Freq: Once | INTRAMUSCULAR | Status: AC
  Administered 2016-08-22: 80 mg via INTRA_ARTICULAR

## 2016-08-22 NOTE — Progress Notes (Signed)
Dr. Frederico Hamman T. Keyshla Tunison, MD, [facility] [place], [address] Phone: [phone] Fax: [phone]  08/22/2016  Patient: Clinton Fowler, MRN: [id number], DOB: 1966-04-26, 50 y.o.  Primary Physician:  Loura Pardon, MD   Chief Complaint  Patient presents with  . Knee Pain    Right  . Shoulder Pain    Bilateral but right is worse   Subjective:   This 50 y.o. male patient noted above presents with shoulder pain that has been ongoing for weeks to months. L shoulder is doing better.  there is no history of trauma or accident recently The patient denies neck pain or radicular symptoms. Denies dislocation, subluxation, separation of the shoulder. The patient does complain of pain in the overhead plane with significant painful arc of motion.  Not sleeping much because shoulders are bothering him.  Up until 2 or 3 weeks ago, wore a brace and seems to have helped it. Leg is shoulder. R shoulder.  Getting deer corn together.   R knee was also bothering him, but now doing better.   Medications Tried: Tylenol, NSAIDS Ice or Heat: minimally helpful Tried PT: No  Prior shoulder Injury: No Prior surgery: Elbow x 2 Prior fracture: No  The PMH, PSH, Social History, Family History, Medications, and allergies have been reviewed in [facility], and have been updated if relevant.  Patient Active Problem List   Diagnosis Date Noted  . Pain in right testicle 08/28/2015  . Hydrocele in adult 08/28/2015  . Routine general medical examination at a health care facility 09/07/2012  . Hyperglycemia 09/07/2012  . Prostate cancer screening 09/07/2012  . Morbid obesity ([place]) 03/06/2012  . ESSENTIAL HYPERTENSION, BENIGN 10/09/2009  . Hyperlipidemia 10/02/2008    Past Medical History:  Diagnosis Date  . Abnormal transaminases    elevated transaminases (likely fatty liver)  . Biceps tendon rupture    history of biceps tendon rupture  bilaterally  . History of gastroesophageal reflux (GERD)   . History of hyperlipidemia   . History of hypertension   . Obesity     Past Surgical History:  Procedure Laterality Date  . BICEPS TENDON REPAIR     ruptured biceps    Social History   Social History  . Marital status: Married    Spouse name: N/A  . Number of children: N/A  . Years of education: N/A   Occupational History  . Not on file.   Social History Main Topics  . Smoking status: Former Research scientist (life sciences)  . Smokeless tobacco: Never Used  . Alcohol use No  . Drug use: No  . Sexual activity: Not on file   Other Topics Concern  . Not on file   Social History Narrative  . No narrative on file    Family History  Problem Relation Age of Onset  . Hypertension Father   . Prostate cancer Father   . Cancer - Other Mother     mouth cancer  . Coronary artery disease Other     Grandmother  . Coronary artery disease Other     Grandfather    No Known Allergies  Medication list reviewed and updated in full in [place].  GEN: No fevers, chills. Nontoxic. Primarily MSK c/o today. MSK: Detailed in the HPI GI: tolerating PO intake without difficulty Neuro: No numbness, parasthesias, or tingling associated. Otherwise the pertinent positives of the ROS are noted above.   Objective:   Blood pressure 114/76,  pulse 80, temperature 98.2 F (36.8 C), temperature source Oral, height 5\' 8"  (1.727 m), weight 278 lb 8 oz (126.3 kg).   GEN: WDWN, NAD, Non-toxic, Alert & Oriented x 3 HEENT: Atraumatic, Normocephalic.  Ears and Nose: No external deformity. EXTR: No clubbing/cyanosis/edema NEURO: Normal gait.  PSYCH: Normally interactive. Conversant. Not depressed or anxious appearing.  Calm demeanor.   Knee:  R Gait: Normal heel toe pattern ROM: 0-130 Effusion: neg Echymosis or edema: none Patellar tendon NT Painful PLICA: neg Patellar grind: negative Medial and lateral patellar facet loading:  negative medial and lateral joint lines:NT Mcmurray's neg Flexion-pinch neg Varus and valgus stress: stable Lachman: neg Ant and Post drawer: neg Hip abduction, IR, ER: WNL Hip flexion str: 5/5 Hip abd: 5/5 Quad: 5/5 VMO atrophy:No Hamstring concentric and eccentric: 5/5   Shoulder: R Inspection: No muscle wasting or winging Ecchymosis/edema: neg  AC joint, scapula, clavicle: NT Cervical spine: NT, full ROM Spurling's: neg Abduction: full, 5/5 Flexion: full, 5/5 IR, full, lift-off: 5/5 ER at neutral: full, 5/5 AC crossover: neg Neer: pos Hawkins: pos Drop Test: neg Empty Can: pos Supraspinatus insertion: mild-mod T Bicipital groove: NT Speed's: neg Yergason's: neg Sulcus sign: neg Scapular dyskinesis: none C5-T1 intact  Neuro: Sensation intact Grip 5/5   Radiology: No results found.  Assessment and Plan:    Impingement syndrome of right shoulder - Plan: Ambulatory referral to Physical Therapy  Subacromial bursitis of right shoulder joint - Plan: Ambulatory referral to Physical Therapy  Acute pain of right knee   Rotator cuff strengthening and scapular stabilization exercises were reviewed with the patient. Retraining shoulder mechanics and function was emphasized to the patient with rehab done at least 5-6 days a week.  The patient could benefit from formal PT to assist with scapular stabilization and RTC strengthening.  Minimal concern over knee. Likely resolving knee OA flare.    SubAC Injection, R Verbal consent was obtained from the patient. Risks (including rare infection), benefits, and alternatives were explained. Patient prepped with Chloraprep and Ethyl Chloride used for anesthesia. The subacromial space was injected using the posterior approach. The patient tolerated the procedure well and had decreased pain post injection. No complications. Injection: 8 cc of Lidocaine 1% and 2 mL of Depo-Medrol 40 mg. Needle: 22 gauge   Follow-up:  prn  Orders Placed This Encounter  Procedures  . Ambulatory referral to Physical Therapy    Signed,  Frederico Hamman T. Dionte Blaustein, MD   Patient's Medications  New Prescriptions   No medications on file  Previous Medications   ATORVASTATIN (LIPITOR) 20 MG TABLET    1TAKE 1 TABLET (20 MG TOTAL) BY MOUTH DAILY.   LISINOPRIL (PRINIVIL,ZESTRIL) 10 MG TABLET    TAKE 1 TABLET (10 MG TOTAL) BY MOUTH DAILY.   OMEPRAZOLE (PRILOSEC) 20 MG CAPSULE    TAKE 1 CAPSULE (20 MG TOTAL) BY MOUTH DAILY.  Modified Medications   No medications on file  Discontinued Medications   No medications on file

## 2016-08-22 NOTE — Addendum Note (Signed)
Addended by: Carter Kitten on: 08/22/2016 02:20 PM   Modules accepted: Orders

## 2016-08-22 NOTE — Progress Notes (Signed)
Pre visit review using our clinic review tool, if applicable. No additional management support is needed unless otherwise documented below in the visit note. 

## 2017-03-13 ENCOUNTER — Ambulatory Visit (INDEPENDENT_AMBULATORY_CARE_PROVIDER_SITE_OTHER): Payer: BC Managed Care – PPO | Admitting: Family Medicine

## 2017-03-13 ENCOUNTER — Encounter: Payer: Self-pay | Admitting: Family Medicine

## 2017-03-13 VITALS — BP 100/70 | HR 80 | Temp 98.4°F | Ht 67.0 in | Wt 282.5 lb

## 2017-03-13 DIAGNOSIS — M25562 Pain in left knee: Secondary | ICD-10-CM | POA: Diagnosis not present

## 2017-03-13 MED ORDER — DICLOFENAC SODIUM 75 MG PO TBEC: 75.0000 mg | DELAYED_RELEASE_TABLET | Freq: Two times a day (BID) | ORAL | 0 refills | Status: DC

## 2017-03-13 NOTE — Progress Notes (Signed)
Dr. Frederico Hamman T. Kameryn Davern, MD, [facility] [place], [address] Phone: [phone] Fax: [phone]  03/13/2017  Patient: Clinton Fowler, MRN: [id number], DOB: 1965-10-22, 51 y.o.  Primary Physician:  Tower, Wynelle Fanny, MD   Chief Complaint  Patient presents with  . Knee Pain    Left x 1 week-No Injury   Subjective:   Clinton Fowler is a 51 y.o. very pleasant male patient who presents with the following:  Pleasant gentleman who is having approximately one week history of left-sided knee pain. He does not have any discrete injury that he can recall. No prior traumatic history of fracture or operative intervention. 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 He has had a knee brace, and he also has taken some ibuprofen at nighttime as well as 2 in the morning.  Past Medical History, Surgical History, Social History, Family History, Problem List, Medications, and Allergies have been reviewed and updated if relevant.  Patient Active Problem List   Diagnosis Date Noted  . Pain in right testicle 08/28/2015  . Hydrocele in adult 08/28/2015  . Routine general medical examination at a health care facility 09/07/2012  . Hyperglycemia 09/07/2012  . Prostate cancer screening 09/07/2012  . Morbid obesity ([place]) 03/06/2012  . ESSENTIAL HYPERTENSION, BENIGN 10/09/2009  . Hyperlipidemia 10/02/2008    Past Medical History:  Diagnosis Date  . Abnormal transaminases    elevated transaminases (likely fatty liver)  . Biceps tendon rupture    history of biceps tendon rupture bilaterally  . History of gastroesophageal reflux (GERD)   . History of hyperlipidemia   . History of hypertension   . Obesity     Past Surgical History:  Procedure Laterality Date  . BICEPS TENDON REPAIR     ruptured biceps    Social History   Social History  .  Marital status: Married    Spouse name: N/A  . Number of children: N/A  . Years of education: N/A   Occupational History  . Not on file.   Social History Main Topics  . Smoking status: Former Research scientist (life sciences)  . Smokeless tobacco: Never Used  . Alcohol use No  . Drug use: No  . Sexual activity: Not on file   Other Topics Concern  . Not on file   Social History Narrative  . No narrative on file    Family History  Problem Relation Age of Onset  . Hypertension Father   . Prostate cancer Father   . Cancer - Other Mother        mouth cancer  . Coronary artery disease Other        Grandmother  . Coronary artery disease Other        Grandfather    No Known Allergies  Medication list reviewed and updated in full in [facility] [place].  GEN: No fevers, chills. Nontoxic. Primarily MSK c/o today. MSK: Detailed in the HPI GI: tolerating PO intake without difficulty Neuro: No numbness, parasthesias, or tingling associated. Otherwise the pertinent positives of the ROS are noted above.   Objective:   BP 100/70   Pulse 80   Temp 98.4 F (36.9 C) (Oral)   Ht 5\' 7"  (1.702 m)   Wt 282 lb 8 oz (128.1 kg)   BMI 44.25 kg/m    GEN: WDWN,  NAD, Non-toxic, Alert & Oriented x 3 HEENT: Atraumatic, Normocephalic.  Ears and Nose: No external deformity. EXTR: No clubbing/cyanosis/edema NEURO: Normal gait.  PSYCH: Normally interactive. Conversant. Not depressed or anxious appearing.  Calm demeanor.   Knee:  L Gait: Normal heel toe pattern ROM: 0-120 Effusion: neg Echymosis or edema: none Patellar tendon NT Painful PLICA: neg Patellar grind: negative Medial and lateral patellar facet loading: negative medial and lateral joint lines:NT Mcmurray's neg Flexion-pinch neg Varus and valgus stress: stable Lachman: neg Ant and Post drawer: neg Hip abduction, IR, ER: WNL Hip flexion str: 5/5 Hip abd: 5/5 Quad: 5/5 VMO atrophy:No Hamstring concentric and eccentric: 5/5   Radiology: No  results found.  Assessment and Plan:   Acute pain of left knee  His knee exam is benign. All ligaments are stable. Do not suspect internal derangement. I suspect this is overuse given his activities as a Psychologist, sport and exercise.  Continue use of brace and Voltaren by mouth twice a day. Ice at nighttime.  Follow-up: No Follow-up on file.  Meds ordered this encounter  Medications  . diclofenac (VOLTAREN) 75 MG EC tablet    Sig: Take 1 tablet (75 mg total) by mouth 2 (two) times daily.    Dispense:  60 tablet    Refill:  0   Signed,  Jamylah Marinaccio T. Jaycee Pelzer, MD   Allergies as of 03/13/2017   No Known Allergies     Medication List       Accurate as of 03/13/17  2:47 PM. Always use your most recent med list.          atorvastatin 20 MG tablet Commonly known as:  LIPITOR 1TAKE 1 TABLET (20 MG TOTAL) BY MOUTH DAILY.   diclofenac 75 MG EC tablet Commonly known as:  VOLTAREN Take 1 tablet (75 mg total) by mouth 2 (two) times daily.   lisinopril 10 MG tablet Commonly known as:  PRINIVIL,ZESTRIL TAKE 1 TABLET (10 MG TOTAL) BY MOUTH DAILY.   omeprazole 20 MG capsule Commonly known as:  PRILOSEC TAKE 1 CAPSULE (20 MG TOTAL) BY MOUTH DAILY.

## 2017-09-07 ENCOUNTER — Other Ambulatory Visit: Payer: Self-pay | Admitting: Family Medicine

## 2017-09-07 NOTE — Telephone Encounter (Signed)
Pt hasn't seen Dr. Tower in over a year and no future appts., please advise  

## 2017-09-07 NOTE — Telephone Encounter (Signed)
Please schedule PE and refill until then  Thanks  

## 2017-09-07 NOTE — Telephone Encounter (Signed)
CPE and lab appt scheduled and med filled

## 2017-09-24 ENCOUNTER — Telehealth: Payer: Self-pay | Admitting: Family Medicine

## 2017-09-24 DIAGNOSIS — R739 Hyperglycemia, unspecified: Secondary | ICD-10-CM

## 2017-09-24 DIAGNOSIS — E7800 Pure hypercholesterolemia, unspecified: Secondary | ICD-10-CM

## 2017-09-24 DIAGNOSIS — Z125 Encounter for screening for malignant neoplasm of prostate: Secondary | ICD-10-CM

## 2017-09-24 DIAGNOSIS — I10 Essential (primary) hypertension: Secondary | ICD-10-CM

## 2017-09-24 NOTE — Telephone Encounter (Signed)
-----   Message from Ellamae Sia sent at 09/18/2017  3:29 PM EST ----- Regarding: Lab orders for Friday, 1.25.19 Patient is scheduled for CPX labs, please order future labs, Thanks , Karna Christmas

## 2017-09-29 ENCOUNTER — Other Ambulatory Visit (INDEPENDENT_AMBULATORY_CARE_PROVIDER_SITE_OTHER): Payer: BC Managed Care – PPO

## 2017-09-29 DIAGNOSIS — E7800 Pure hypercholesterolemia, unspecified: Secondary | ICD-10-CM

## 2017-09-29 DIAGNOSIS — I10 Essential (primary) hypertension: Secondary | ICD-10-CM

## 2017-09-29 DIAGNOSIS — R739 Hyperglycemia, unspecified: Secondary | ICD-10-CM

## 2017-09-29 DIAGNOSIS — Z125 Encounter for screening for malignant neoplasm of prostate: Secondary | ICD-10-CM | POA: Diagnosis not present

## 2017-09-29 LAB — LIPID PANEL
CHOL/HDL RATIO: 4
Cholesterol: 130 mg/dL (ref 0–200)
HDL: 37 mg/dL — AB (ref >39.00)
LDL Cholesterol: 74 mg/dL (ref 0–99)
NONHDL: 92.85
Triglycerides: 95 mg/dL (ref 0.0–149.0)
VLDL: 19 mg/dL (ref 0.0–40.0)

## 2017-09-29 LAB — PSA: PSA: 0.55 ng/mL (ref 0.10–4.00)

## 2017-09-29 LAB — CBC WITH DIFFERENTIAL/PLATELET
BASOS ABS: 0 10*3/uL (ref 0.0–0.1)
BASOS PCT: 0.1 % (ref 0.0–3.0)
Eosinophils Absolute: 0.2 10*3/uL (ref 0.0–0.7)
Eosinophils Relative: 2.4 % (ref 0.0–5.0)
HCT: 43.3 % (ref 39.0–52.0)
HEMOGLOBIN: 14.4 g/dL (ref 13.0–17.0)
LYMPHS ABS: 2.9 10*3/uL (ref 0.7–4.0)
LYMPHS PCT: 33.8 % (ref 12.0–46.0)
MCHC: 33.4 g/dL (ref 30.0–36.0)
MCV: 83.9 fl (ref 78.0–100.0)
MONO ABS: 0.7 10*3/uL (ref 0.1–1.0)
Monocytes Relative: 8 % (ref 3.0–12.0)
NEUTROS ABS: 4.7 10*3/uL (ref 1.4–7.7)
NEUTROS PCT: 55.7 % (ref 43.0–77.0)
Platelets: 215 10*3/uL (ref 150.0–400.0)
RBC: 5.16 Mil/uL (ref 4.22–5.81)
RDW: 14.5 % (ref 11.5–15.5)
WBC: 8.5 10*3/uL (ref 4.0–10.5)

## 2017-09-29 LAB — TSH: TSH: 2.06 u[IU]/mL (ref 0.35–4.50)

## 2017-09-29 LAB — COMPREHENSIVE METABOLIC PANEL
ALT: 35 U/L (ref 0–53)
AST: 22 U/L (ref 0–37)
Albumin: 4 g/dL (ref 3.5–5.2)
Alkaline Phosphatase: 49 U/L (ref 39–117)
BUN: 15 mg/dL (ref 6–23)
CALCIUM: 8.7 mg/dL (ref 8.4–10.5)
CHLORIDE: 101 meq/L (ref 96–112)
CO2: 32 meq/L (ref 19–32)
CREATININE: 1.06 mg/dL (ref 0.40–1.50)
GFR: 78.82 mL/min (ref >60.00)
Glucose, Bld: 116 mg/dL — ABNORMAL HIGH (ref 70–99)
Potassium: 4.1 mEq/L (ref 3.5–5.1)
SODIUM: 141 meq/L (ref 135–145)
Total Bilirubin: 0.6 mg/dL (ref 0.2–1.2)
Total Protein: 6.7 g/dL (ref 6.0–8.3)

## 2017-09-29 LAB — HEMOGLOBIN A1C: HEMOGLOBIN A1C: 6.3 % (ref 4.6–6.5)

## 2017-10-03 ENCOUNTER — Ambulatory Visit (INDEPENDENT_AMBULATORY_CARE_PROVIDER_SITE_OTHER): Payer: BC Managed Care – PPO | Admitting: Family Medicine

## 2017-10-03 ENCOUNTER — Encounter: Payer: Self-pay | Admitting: Family Medicine

## 2017-10-03 VITALS — BP 130/78 | HR 79 | Temp 98.0°F | Ht 67.25 in | Wt 275.5 lb

## 2017-10-03 DIAGNOSIS — I10 Essential (primary) hypertension: Secondary | ICD-10-CM | POA: Diagnosis not present

## 2017-10-03 DIAGNOSIS — Z0000 Encounter for general adult medical examination without abnormal findings: Secondary | ICD-10-CM

## 2017-10-03 DIAGNOSIS — Z23 Encounter for immunization: Secondary | ICD-10-CM | POA: Diagnosis not present

## 2017-10-03 DIAGNOSIS — E7800 Pure hypercholesterolemia, unspecified: Secondary | ICD-10-CM | POA: Diagnosis not present

## 2017-10-03 DIAGNOSIS — R7303 Prediabetes: Secondary | ICD-10-CM | POA: Diagnosis not present

## 2017-10-03 DIAGNOSIS — Z125 Encounter for screening for malignant neoplasm of prostate: Secondary | ICD-10-CM | POA: Diagnosis not present

## 2017-10-03 MED ORDER — OMEPRAZOLE 20 MG PO CPDR: DELAYED_RELEASE_CAPSULE | ORAL | 3 refills | Status: DC

## 2017-10-03 MED ORDER — LISINOPRIL 10 MG PO TABS: ORAL_TABLET | ORAL | 3 refills | Status: DC

## 2017-10-03 MED ORDER — ATORVASTATIN CALCIUM 20 MG PO TABS: ORAL_TABLET | ORAL | 3 refills | Status: DC

## 2017-10-03 NOTE — Patient Instructions (Addendum)
For diabetes prevention -work on weight loss  First - minimize sweets   Get rid of sugar beverages - soda/ sweet tea / coffee drinks/gatorade/juice -- drink water and occasional diet drinks and unsweet tea  Try to get most of your carbohydrates from produce (with the exception of white potatoes)  Eat less bread/pasta/rice/snack foods/cereals/sweets and other items from the middle of the grocery store (processed carbs)   Stay active-add exercise during the times you do not get as much   Flu shot today

## 2017-10-03 NOTE — Progress Notes (Signed)
Subjective:    Patient ID: Clinton Fowler, male    DOB: 06/21/1966, 52 y.o.   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0 Thinks he can change his diet  Working on weight loss  Not a big sweet eater- at times (holidays)  He does drink soda/sweet tea   Hyperlipidemia Lab Results  Component Value Date   CHOL 130 09/29/2017   CHOL 154 08/11/2016   CHOL 143 10/21/2014   Lab Results  Component Value Date   HDL 37.00 (L) 09/29/2017   HDL 38.80 (L) 08/11/2016   HDL 37.40 (L) 10/21/2014   Lab Results  Component Value Date   LDLCALC 74 09/29/2017   LDLCALC 96 08/11/2016   LDLCALC 84 10/21/2014   Lab Results  Component Value Date   TRIG 95.0 09/29/2017   TRIG 96.0 08/11/2016   TRIG 108.0 10/21/2014   Lab Results  Component Value Date   CHOLHDL 4 09/29/2017   CHOLHDL 4 08/11/2016   CHOLHDL 4 10/21/2014   Lab Results  Component  Value Date   LDLDIRECT 160.7 09/07/2012   LDLDIRECT 157.4 03/06/2012   LDLDIRECT 161.6 10/09/2009  between diet and statin- good LDL   Lab Results  Component Value Date   WBC 8.5 09/29/2017   HGB 14.4 09/29/2017   HCT 43.3 09/29/2017   MCV 83.9 09/29/2017   PLT 215.0 09/29/2017    Lab Results  Component Value Date   CREATININE 1.06 09/29/2017   BUN 15 09/29/2017   NA 141 09/29/2017   K 4.1 09/29/2017   CL 101 09/29/2017   CO2 32 09/29/2017   Lab Results  Component Value Date   ALT 35 09/29/2017   AST 22 09/29/2017   ALKPHOS 49 09/29/2017   BILITOT 0.6 09/29/2017    Lab Results  Component Value Date   TSH 2.06 09/29/2017     Patient Active Problem List   Diagnosis Date Noted  . Pain in right testicle 08/28/2015  . Hydrocele in adult 08/28/2015  . Routine general medical examination at a health care facility 09/07/2012  . Prediabetes 09/07/2012  . Prostate cancer screening 09/07/2012  . Morbid obesity ([place]) 03/06/2012  . ESSENTIAL  HYPERTENSION, BENIGN 10/09/2009  . Hyperlipidemia 10/02/2008   Past Medical History:  Diagnosis Date  . Abnormal transaminases    elevated transaminases (likely fatty liver)  . Biceps tendon rupture    history of biceps tendon rupture bilaterally  . History of gastroesophageal reflux (GERD)   . History of hyperlipidemia   . History of hypertension   . Obesity    Past Surgical History:  Procedure Laterality Date  . BICEPS TENDON REPAIR     ruptured biceps   Social History   Tobacco Use  . Smoking status: Former Research scientist (life sciences)  . Smokeless tobacco: Never Used  Substance Use Topics  . Alcohol use: No  . Drug use: No   Family History  Problem Relation Age of Onset  . Hypertension Father   . Prostate cancer Father   . Cancer - Other Mother        mouth cancer  . Coronary artery disease Other        Grandmother  . Coronary artery disease Other        Grandfather   No Known Allergies No current outpatient medications on file  prior to visit.   No current facility-administered medications on file prior to visit.     Review of Systems  Constitutional: Positive for appetite change and fatigue. Negative for fever.  HENT: Positive for congestion, postnasal drip, rhinorrhea, sinus pressure, sneezing and sore throat. Negative for ear pain.   Eyes: Negative for pain and discharge.  Respiratory: Positive for cough. Negative for shortness of breath, wheezing and stridor.   Cardiovascular: Negative for chest pain.  Gastrointestinal: Negative for diarrhea, nausea and vomiting.  Genitourinary: Negative for frequency, hematuria and urgency.  Musculoskeletal: Negative for arthralgias and myalgias.  Skin: Negative for rash.  Neurological: Positive for headaches. Negative for dizziness, weakness and light-headedness.  Psychiatric/Behavioral: Negative for confusion and dysphoric mood.       Objective:   Physical Exam  Constitutional: He appears well-developed and well-nourished. No distress.  obese and well appearing   HENT:  Head: Normocephalic and atraumatic.  Right Ear: External ear normal.  Left Ear: External ear normal.  Mouth/Throat: Oropharynx is clear and moist.  Nares are injected and congested  Clear pnd and rhinorrhea  Eyes: Conjunctivae and EOM are normal. Pupils are equal, round, and reactive to light. Right eye exhibits no discharge. Left eye exhibits no discharge. No scleral icterus.  Neck: Normal range of motion. Neck supple. No JVD present. Carotid bruit is not present. No thyromegaly present.  Cardiovascular: Normal rate, regular rhythm, normal heart sounds and intact distal pulses. Exam reveals no gallop.  Pulmonary/Chest: Effort normal and breath sounds normal. No respiratory distress. He has no wheezes. He exhibits no tenderness.  Good air exch  Abdominal: Soft. Bowel sounds are normal. He exhibits no distension, no abdominal bruit and no mass. There is no tenderness.  Musculoskeletal: He exhibits  no edema or tenderness.  Lymphadenopathy:    He has no cervical adenopathy.  Neurological: He is alert. He has normal reflexes. No cranial nerve deficit. He exhibits normal muscle tone. Coordination normal.  Skin: Skin is warm and dry. No rash noted. No erythema. No pallor.  Psychiatric: He has a normal mood and affect.          Assessment & Plan:   Problem List Items Addressed This Visit      Cardiovascular and Mediastinum   ESSENTIAL HYPERTENSION, BENIGN    bp in fair control at this time  BP Readings from  Last 1 Encounters:  10/03/17 130/78   No changes needed Disc lifstyle change with low sodium diet and exercise  Labs rev      Relevant Medications   lisinopril (PRINIVIL,ZESTRIL) 10 MG tablet   atorvastatin (LIPITOR) 20 MG tablet     Other   Hyperlipidemia    Disc goals for lipids and reasons to control them Rev labs with pt Rev low sat fat diet in detail Continue statin and diet       Relevant Medications   lisinopril (PRINIVIL,ZESTRIL) 10 MG tablet   atorvastatin (LIPITOR) 20 MG tablet   Morbid obesity (HCC)    Discussed how this problem influences overall health and the risks it imposes  Reviewed plan for weight loss with lower calorie diet (via better food choices and also portion control or program like weight watchers) and exercise building up to or more than 30 minutes 5 days per week including some aerobic activity         Prediabetes    Lab Results  Component Value Date   HGBA1C 6.3 09/29/2017   This is up disc imp of low glycemic diet and wt loss to prevent DM2  Literature given on diet       Prostate cancer screening    Lab Results  Component Value Date   PSA 0.55 09/29/2017   PSA 0.71 08/11/2016   PSA 0.51 10/21/2014   No clinical changes       Routine general medical examination at a health care facility - Primary    Reviewed health habits including diet and exercise and skin cancer prevention Reviewed appropriate screening tests  for age  Also reviewed health mt list, fam hx and immunization status , as well as social and family history   See HPI Flu shot today  Labs rev  Disc low glycemic diet and need for wt loss        Other Visit Diagnoses    Need for influenza vaccination       Relevant Orders   Flu Vaccine QUAD 6+ mos PF IM (Fluarix Quad PF) (Completed)

## 2017-10-04 NOTE — Assessment & Plan Note (Signed)
Reviewed health habits including diet and exercise and skin cancer prevention Reviewed appropriate screening tests for age  Also reviewed health mt list, fam hx and immunization status , as well as social and family history   See HPI Flu shot today  Labs rev  Disc low glycemic diet and need for wt loss

## 2017-10-04 NOTE — Assessment & Plan Note (Signed)
Disc goals for lipids and reasons to control them Rev labs with pt Rev low sat fat diet in detail Continue statin and diet  

## 2017-10-04 NOTE — Assessment & Plan Note (Signed)
Lab Results  Component Value Date   HGBA1C 6.3 09/29/2017   This is up disc imp of low glycemic diet and wt loss to prevent DM2  Literature given on diet

## 2017-10-04 NOTE — Assessment & Plan Note (Signed)
Lab Results  Component Value Date   PSA 0.55 09/29/2017   PSA 0.71 08/11/2016   PSA 0.51 10/21/2014   No clinical changes

## 2017-10-04 NOTE — Assessment & Plan Note (Signed)
Discussed how this problem influences overall health and the risks it imposes  Reviewed plan for weight loss with lower calorie diet (via better food choices and also portion control or program like weight watchers) and exercise building up to or more than 30 minutes 5 days per week including some aerobic activity    

## 2017-10-04 NOTE — Assessment & Plan Note (Signed)
bp in fair control at this time  BP Readings from Last 1 Encounters:  10/03/17 130/78   No changes needed Disc lifstyle change with low sodium diet and exercise  Labs rev

## 2017-10-09 ENCOUNTER — Telehealth: Payer: Self-pay | Admitting: Family Medicine

## 2017-10-09 NOTE — Telephone Encounter (Signed)
Copied from [place] [id number]. 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

## 2017-10-09 NOTE — Telephone Encounter (Signed)
Pt notified letter ready for pick up. 

## 2017-10-09 NOTE — Telephone Encounter (Signed)
Letter done in IN box

## 2017-10-09 NOTE — Telephone Encounter (Signed)
Pt had annual on 10/03/17.Please advise.

## 2018-10-08 ENCOUNTER — Other Ambulatory Visit: Payer: Self-pay | Admitting: Family Medicine

## 2018-10-09 NOTE — Telephone Encounter (Signed)
Pt hasn't been seen in over a year, please advise

## 2018-10-09 NOTE — Telephone Encounter (Signed)
Please schedule a f/u and refill until then 

## 2018-10-10 NOTE — Telephone Encounter (Signed)
I left a message on patient's voice mail to return my call.  If patient returns my call, please schedule a f/u appointment with Dr.Tower.

## 2018-10-24 ENCOUNTER — Ambulatory Visit: Payer: BC Managed Care – PPO | Admitting: Family Medicine

## 2018-10-24 ENCOUNTER — Encounter: Payer: Self-pay | Admitting: Family Medicine

## 2018-10-24 VITALS — BP 128/72 | HR 81 | Temp 97.9°F | Ht 67.25 in | Wt 286.1 lb

## 2018-10-24 DIAGNOSIS — E7800 Pure hypercholesterolemia, unspecified: Secondary | ICD-10-CM

## 2018-10-24 DIAGNOSIS — R7303 Prediabetes: Secondary | ICD-10-CM

## 2018-10-24 DIAGNOSIS — Z23 Encounter for immunization: Secondary | ICD-10-CM | POA: Diagnosis not present

## 2018-10-24 DIAGNOSIS — Z125 Encounter for screening for malignant neoplasm of prostate: Secondary | ICD-10-CM

## 2018-10-24 DIAGNOSIS — I10 Essential (primary) hypertension: Secondary | ICD-10-CM

## 2018-10-24 LAB — COMPREHENSIVE METABOLIC PANEL
ALK PHOS: 50 U/L (ref 39–117)
ALT: 30 U/L (ref 0–53)
AST: 21 U/L (ref 0–37)
Albumin: 4.2 g/dL (ref 3.5–5.2)
BUN: 12 mg/dL (ref 6–23)
CO2: 26 mEq/L (ref 19–32)
Calcium: 8.8 mg/dL (ref 8.4–10.5)
Chloride: 104 mEq/L (ref 96–112)
Creatinine, Ser: 1.05 mg/dL (ref 0.40–1.50)
GFR: 74.65 mL/min (ref >60.00)
Glucose, Bld: 109 mg/dL — ABNORMAL HIGH (ref 70–99)
POTASSIUM: 4 meq/L (ref 3.5–5.1)
Sodium: 140 mEq/L (ref 135–145)
Total Bilirubin: 0.6 mg/dL (ref 0.2–1.2)
Total Protein: 6.8 g/dL (ref 6.0–8.3)

## 2018-10-24 LAB — CBC WITH DIFFERENTIAL/PLATELET
Basophils Absolute: 0 10*3/uL (ref 0.0–0.1)
Basophils Relative: 0.1 % (ref 0.0–3.0)
Eosinophils Absolute: 0.2 10*3/uL (ref 0.0–0.7)
Eosinophils Relative: 2.8 % (ref 0.0–5.0)
HCT: 44.2 % (ref 39.0–52.0)
Hemoglobin: 14.5 g/dL (ref 13.0–17.0)
Lymphocytes Relative: 30.2 % (ref 12.0–46.0)
Lymphs Abs: 2.2 10*3/uL (ref 0.7–4.0)
MCHC: 32.9 g/dL (ref 30.0–36.0)
MCV: 84.6 fl (ref 78.0–100.0)
MONOS PCT: 7.4 % (ref 3.0–12.0)
Monocytes Absolute: 0.5 10*3/uL (ref 0.1–1.0)
Neutro Abs: 4.3 10*3/uL (ref 1.4–7.7)
Neutrophils Relative %: 59.5 % (ref 43.0–77.0)
Platelets: 211 10*3/uL (ref 150.0–400.0)
RBC: 5.23 Mil/uL (ref 4.22–5.81)
RDW: 15.2 % (ref 11.5–15.5)
WBC: 7.3 10*3/uL (ref 4.0–10.5)

## 2018-10-24 LAB — LIPID PANEL
Cholesterol: 134 mg/dL (ref 0–200)
HDL: 39 mg/dL — AB (ref >39.00)
LDL Cholesterol: 76 mg/dL (ref 0–99)
NonHDL: 94.89
Total CHOL/HDL Ratio: 3
Triglycerides: 92 mg/dL (ref 0.0–149.0)
VLDL: 18.4 mg/dL (ref 0.0–40.0)

## 2018-10-24 LAB — TSH: TSH: 1.25 u[IU]/mL (ref 0.35–4.50)

## 2018-10-24 LAB — PSA: PSA: 0.53 ng/mL (ref 0.10–4.00)

## 2018-10-24 LAB — HEMOGLOBIN A1C: Hgb A1c MFr Bld: 6.2 % (ref 4.6–6.5)

## 2018-10-24 MED ORDER — LISINOPRIL 10 MG PO TABS: ORAL_TABLET | ORAL | 3 refills | Status: DC

## 2018-10-24 MED ORDER — ATORVASTATIN CALCIUM 20 MG PO TABS: ORAL_TABLET | ORAL | 3 refills | Status: DC

## 2018-10-24 MED ORDER — OMEPRAZOLE 20 MG PO CPDR: 20.0000 mg | DELAYED_RELEASE_CAPSULE | Freq: Every day | ORAL | 3 refills | Status: DC

## 2018-10-24 NOTE — Assessment & Plan Note (Signed)
Family hx of prostate cancer in father No symptoms at all  PSA with lab draw today

## 2018-10-24 NOTE — Assessment & Plan Note (Addendum)
Discussed how this problem influences overall health and the risks it imposes  Reviewed plan for weight loss with lower calorie diet (via better food choices and also portion control or program like weight watchers) and exercise building up to or more than 30 minutes 5 days per week including some aerobic activity   Wife and mother do the cooking Strongly enc him to talk to family about lower fat/sugar/sodium diet

## 2018-10-24 NOTE — Assessment & Plan Note (Signed)
Disc goals for lipids and reasons to control them Rev last labs with pt Rev low sat fat diet in detail Labs today  Continues atorvastatin

## 2018-10-24 NOTE — Assessment & Plan Note (Signed)
A1C today  Expect it to be up with current wt gain /morbid obesity Again enc to work on diet /exercise for wt loss  disc imp of low glycemic diet and wt loss to prevent DM2

## 2018-10-24 NOTE — Assessment & Plan Note (Signed)
bp in fair control at this time  BP Readings from Last 1 Encounters:  10/24/18 128/72   No changes needed Most recent labs reviewed  Disc lifstyle change with low sodium diet and exercise  Continue lisinopril Labs today

## 2018-10-24 NOTE — Progress Notes (Signed)
Subjective:    Patient ID: Clinton Fowler, male    DOB: [birth year], 53 y.o.   MRN: [id number]  HPI Here for f/u of chronic health problems  Doing pretty good  Not doing a whole lot talely   Wt Readings from Last 3 Encounters:  10/24/18 286 lb 1 oz (129.8 kg)  10/03/17 275 lb 8 oz (125 kg)  03/13/17 282 lb 8 oz (128.1 kg)  has been doing some walking in the evenings  Works physically during the busy season  44.47 kg/m   Eats a little less than usual  Mother and wife cook for him    Given flu shot today   bp is stable today  No cp or palpitations or headaches or edema  No side effects to medicines  BP Readings from Last 3 Encounters:  10/24/18 128/72  10/03/17 130/78  03/13/17 100/70    Taking lisinopril   H/o prostate cancer in his father  Due for psa  No voiding issues or dribbling  Nocturia - only once in a while   Prediabetes Lab Results  Component Value Date   HGBA1C 6.3 09/29/2017   Due for labs Does not eat a lot of sweets  No excessive thirst or urination   Hyperlipidemia Lab Results  Component Value Date   CHOL 130 09/29/2017   HDL 37.00 (L) 09/29/2017   LDLCALC 74 09/29/2017   LDLDIRECT 160.7 09/07/2012   TRIG 95.0 09/29/2017   CHOLHDL 4 09/29/2017   Due for labs  On atorvastatin  20  Is mindful of diet   Patient Active Problem List   Diagnosis Date Noted  . Hydrocele in adult 08/28/2015  . Routine general medical examination at a health care facility 09/07/2012  . Prediabetes 09/07/2012  . Prostate cancer screening 09/07/2012  . Morbid obesity ([place]) 03/06/2012  . ESSENTIAL HYPERTENSION, BENIGN 10/09/2009  . Hyperlipidemia 10/02/2008   Past Medical History:  Diagnosis Date  . Abnormal transaminases    elevated transaminases (likely fatty liver)  . Biceps tendon rupture    history of biceps tendon rupture bilaterally  . History of gastroesophageal reflux (GERD)   . History of hyperlipidemia   . History of hypertension   .  Obesity    Past Surgical History:  Procedure Laterality Date  . BICEPS TENDON REPAIR     ruptured biceps   Social History   Tobacco Use  . Smoking status: Former Research scientist (life sciences)  . Smokeless tobacco: Never Used  Substance Use Topics  . Alcohol use: No  . Drug use: No   Family History  Problem Relation Age of Onset  . Hypertension Father   . Prostate cancer Father   . Cancer - Other Mother        mouth cancer  . Coronary artery disease Other        Grandmother  . Coronary artery disease Other        Grandfather   No Known Allergies No current outpatient medications on file prior to visit.   No current facility-administered medications on file prior to visit.     Review of Systems  Constitutional: Negative for activity change, appetite change, fatigue, fever and unexpected weight change.  HENT: Negative for congestion, rhinorrhea, sore throat and trouble swallowing.   Eyes: Negative for pain, redness, itching and visual disturbance.  Respiratory: Negative for cough, chest tightness, shortness of breath and wheezing.   Cardiovascular: Negative for chest pain and palpitations.  Gastrointestinal: Negative for abdominal pain, blood in stool,  constipation, diarrhea and nausea.       No heartburn unless he misses his ppi  Endocrine: Negative for cold intolerance, heat intolerance, polydipsia and polyuria.  Genitourinary: Negative for difficulty urinating, dysuria, frequency and urgency.  Musculoskeletal: Negative for arthralgias, joint swelling and myalgias.  Skin: Negative for pallor and rash.  Neurological: Negative for dizziness, tremors, weakness, numbness and headaches.  Hematological: Negative for adenopathy. Does not bruise/bleed easily.  Psychiatric/Behavioral: Negative for decreased concentration and dysphoric mood. The patient is not nervous/anxious.        Objective:   Physical Exam Constitutional:      General: He is not in acute distress.    Appearance: He is  well-developed. He is obese.     Comments: Morbidly obese and well app  HENT:     Head: Normocephalic and atraumatic.     Mouth/Throat:     Mouth: Mucous membranes are moist.     Pharynx: Oropharynx is clear.  Eyes:     Conjunctiva/sclera: Conjunctivae normal.     Pupils: Pupils are equal, round, and reactive to light.  Neck:     Musculoskeletal: Normal range of motion and neck supple.     Thyroid: No thyromegaly.     Vascular: No carotid bruit or JVD.  Cardiovascular:     Rate and Rhythm: Normal rate and regular rhythm.     Pulses: Normal pulses.     Heart sounds: Normal heart sounds. No gallop.   Pulmonary:     Effort: Pulmonary effort is normal. No respiratory distress.     Breath sounds: Normal breath sounds. No wheezing or rales.  Abdominal:     General: Bowel sounds are normal. There is no distension or abdominal bruit.     Palpations: Abdomen is soft. There is no mass.     Tenderness: There is no abdominal tenderness.  Musculoskeletal:     Right lower leg: No edema.     Left lower leg: No edema.  Lymphadenopathy:     Cervical: No cervical adenopathy.  Skin:    General: Skin is warm and dry.     Findings: No rash.     Comments: 2 non inflamed seb cysts on back  Solar lentigines diffusely  Several brown nevi on trunk  Ruddy complexion  Neurological:     General: No focal deficit present.     Mental Status: He is alert.     Deep Tendon Reflexes: Reflexes are normal and symmetric.  Psychiatric:        Mood and Affect: Mood normal.           Assessment & Plan:   Problem List Items Addressed This Visit      Cardiovascular and Mediastinum   ESSENTIAL HYPERTENSION, BENIGN - Primary    bp in fair control at this time  BP Readings from Last 1 Encounters:  10/24/18 128/72   No changes needed Most recent labs reviewed  Disc lifstyle change with low sodium diet and exercise  Continue lisinopril Labs today      Relevant Medications   atorvastatin (LIPITOR)  20 MG tablet   lisinopril (PRINIVIL,ZESTRIL) 10 MG tablet   Other Relevant Orders   CBC with Differential/Platelet   Comprehensive metabolic panel   Lipid panel   TSH     Other   Hyperlipidemia    Disc goals for lipids and reasons to control them Rev last labs with pt Rev low sat fat diet in detail Labs today  Continues atorvastatin  Relevant Medications   atorvastatin (LIPITOR) 20 MG tablet   lisinopril (PRINIVIL,ZESTRIL) 10 MG tablet   Other Relevant Orders   Lipid panel   Morbid obesity ([place])    Discussed how this problem influences overall health and the risks it imposes  Reviewed plan for weight loss with lower calorie diet (via better food choices and also portion control or program like weight watchers) and exercise building up to or more than 30 minutes 5 days per week including some aerobic activity   Wife and mother do the cooking Strongly enc him to talk to family about lower fat/sugar/sodium diet         Prediabetes    A1C today  Expect it to be up with current wt gain /morbid obesity Again enc to work on diet /exercise for wt loss  disc imp of low glycemic diet and wt loss to prevent DM2       Relevant Orders   Hemoglobin A1c   Prostate cancer screening    Family hx of prostate cancer in father No symptoms at all  PSA with lab draw today      Relevant Orders   PSA    Other Visit Diagnoses    Need for influenza vaccination       Relevant Orders   Flu Vaccine QUAD 6+ mos PF IM (Fluarix Quad PF) (Completed)

## 2018-10-24 NOTE — Patient Instructions (Addendum)
To prevent diabetes  Try to get most of your carbohydrates from produce (with the exception of white potatoes)  Eat less bread/pasta/rice/snack foods/cereals/sweets and other items from the middle of the grocery store (processed carbs)  This also helps with weight loss tremendously    Stay active Take care of yourself   Flu vaccine today   Labs today

## 2018-10-25 ENCOUNTER — Encounter: Payer: Self-pay | Admitting: *Deleted

## 2019-12-14 ENCOUNTER — Other Ambulatory Visit: Payer: Self-pay | Admitting: Family Medicine

## 2019-12-16 NOTE — Telephone Encounter (Signed)
Since appt is tomorrow will hold until CPE

## 2019-12-16 NOTE — Telephone Encounter (Signed)
Pt hasn't been seen in over a year and no future appts., please schedule a CPE or at least a f/u and route refills back to me to fill once his appt has been made, thanks

## 2019-12-16 NOTE — Telephone Encounter (Signed)
Patient scheduled cpx on 12/17/19 at 8:30.

## 2019-12-17 ENCOUNTER — Other Ambulatory Visit: Payer: Self-pay

## 2019-12-17 ENCOUNTER — Ambulatory Visit (INDEPENDENT_AMBULATORY_CARE_PROVIDER_SITE_OTHER): Payer: BC Managed Care – PPO | Admitting: Family Medicine

## 2019-12-17 ENCOUNTER — Encounter: Payer: Self-pay | Admitting: Family Medicine

## 2019-12-17 VITALS — BP 132/80 | HR 78 | Temp 97.1°F | Ht 67.5 in | Wt 286.2 lb

## 2019-12-17 DIAGNOSIS — E7800 Pure hypercholesterolemia, unspecified: Secondary | ICD-10-CM

## 2019-12-17 DIAGNOSIS — Z6841 Body Mass Index (BMI) 40.0 and over, adult: Secondary | ICD-10-CM

## 2019-12-17 DIAGNOSIS — Z1211 Encounter for screening for malignant neoplasm of colon: Secondary | ICD-10-CM | POA: Insufficient documentation

## 2019-12-17 DIAGNOSIS — Z0000 Encounter for general adult medical examination without abnormal findings: Secondary | ICD-10-CM

## 2019-12-17 DIAGNOSIS — I10 Essential (primary) hypertension: Secondary | ICD-10-CM

## 2019-12-17 DIAGNOSIS — Z23 Encounter for immunization: Secondary | ICD-10-CM

## 2019-12-17 DIAGNOSIS — Z125 Encounter for screening for malignant neoplasm of prostate: Secondary | ICD-10-CM

## 2019-12-17 DIAGNOSIS — Z0001 Encounter for general adult medical examination with abnormal findings: Secondary | ICD-10-CM | POA: Diagnosis not present

## 2019-12-17 DIAGNOSIS — R7303 Prediabetes: Secondary | ICD-10-CM

## 2019-12-17 LAB — CBC WITH DIFFERENTIAL/PLATELET
Basophils Absolute: 0 10*3/uL (ref 0.0–0.1)
Basophils Relative: 0.2 % (ref 0.0–3.0)
Eosinophils Absolute: 0.2 10*3/uL (ref 0.0–0.7)
Eosinophils Relative: 2.5 % (ref 0.0–5.0)
HCT: 42.2 % (ref 39.0–52.0)
Hemoglobin: 13.8 g/dL (ref 13.0–17.0)
Lymphocytes Relative: 25.8 % (ref 12.0–46.0)
Lymphs Abs: 1.9 10*3/uL (ref 0.7–4.0)
MCHC: 32.8 g/dL (ref 30.0–36.0)
MCV: 86.3 fl (ref 78.0–100.0)
Monocytes Absolute: 0.6 10*3/uL (ref 0.1–1.0)
Monocytes Relative: 8.1 % (ref 3.0–12.0)
Neutro Abs: 4.6 10*3/uL (ref 1.4–7.7)
Neutrophils Relative %: 63.4 % (ref 43.0–77.0)
Platelets: 198 10*3/uL (ref 150.0–400.0)
RBC: 4.89 Mil/uL (ref 4.22–5.81)
RDW: 14.6 % (ref 11.5–15.5)
WBC: 7.3 10*3/uL (ref 4.0–10.5)

## 2019-12-17 LAB — LIPID PANEL
Cholesterol: 130 mg/dL (ref 0–200)
HDL: 35.2 mg/dL — ABNORMAL LOW (ref >39.00)
LDL Cholesterol: 78 mg/dL (ref 0–99)
NonHDL: 94.49
Total CHOL/HDL Ratio: 4
Triglycerides: 80 mg/dL (ref 0.0–149.0)
VLDL: 16 mg/dL (ref 0.0–40.0)

## 2019-12-17 LAB — COMPREHENSIVE METABOLIC PANEL
ALT: 25 U/L (ref 0–53)
AST: 18 U/L (ref 0–37)
Albumin: 4.1 g/dL (ref 3.5–5.2)
Alkaline Phosphatase: 49 U/L (ref 39–117)
BUN: 14 mg/dL (ref 6–23)
CO2: 30 mEq/L (ref 19–32)
Calcium: 8.6 mg/dL (ref 8.4–10.5)
Chloride: 104 mEq/L (ref 96–112)
Creatinine, Ser: 1 mg/dL (ref 0.40–1.50)
GFR: 78.62 mL/min (ref >60.00)
Glucose, Bld: 116 mg/dL — ABNORMAL HIGH (ref 70–99)
Potassium: 4.1 mEq/L (ref 3.5–5.1)
Sodium: 139 mEq/L (ref 135–145)
Total Bilirubin: 0.3 mg/dL (ref 0.2–1.2)
Total Protein: 6.4 g/dL (ref 6.0–8.3)

## 2019-12-17 LAB — TSH: TSH: 1.5 u[IU]/mL (ref 0.35–4.50)

## 2019-12-17 LAB — PSA: PSA: 0.44 ng/mL (ref 0.10–4.00)

## 2019-12-17 LAB — HEMOGLOBIN A1C: Hgb A1c MFr Bld: 6.3 % (ref 4.6–6.5)

## 2019-12-17 MED ORDER — ATORVASTATIN CALCIUM 20 MG PO TABS: ORAL_TABLET | ORAL | 3 refills | Status: DC

## 2019-12-17 MED ORDER — OMEPRAZOLE 20 MG PO CPDR: 20.0000 mg | DELAYED_RELEASE_CAPSULE | Freq: Every day | ORAL | 3 refills | Status: DC

## 2019-12-17 MED ORDER — LISINOPRIL 10 MG PO TABS: ORAL_TABLET | ORAL | 3 refills | Status: DC

## 2019-12-17 NOTE — Assessment & Plan Note (Signed)
Reviewed health habits including diet and exercise and skin cancer prevention Reviewed appropriate screening tests for age  Also reviewed health mt list, fam hx and immunization status , as well as social and family history   See HPI Labs drawn  Td given  Considering covid vaccination later  Colonoscopy referral done  Encourage flu shot in the fall

## 2019-12-17 NOTE — Assessment & Plan Note (Signed)
psa incl in labs Father had prostate cancer in 90s No symptoms

## 2019-12-17 NOTE — Assessment & Plan Note (Signed)
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

## 2019-12-17 NOTE — Assessment & Plan Note (Signed)
Disc goals for lipids and reasons to control them Rev last labs with pt Rev low sat fat diet in detail Panel drawn today  Continues low dose atorvastatin

## 2019-12-17 NOTE — Patient Instructions (Addendum)
To prevent diabetes Try to get most of your carbohydrates from produce (with the exception of white potatoes)  Eat less bread/pasta/rice/snack foods/cereals/sweets and other items from the middle of the grocery store (processed carbs)   For high cholesterol Avoid red meat/ fried foods/ egg yolks/ fatty breakfast meats/ butter, cheese and high fat dairy/ and shellfish    Try to add in more exercise in the off months  think about ways to cut calories for weight loss   The office will call you regarding colonoscopy   Wear sun protection  Take care of yourself   Tetanus shot today

## 2019-12-17 NOTE — Assessment & Plan Note (Signed)
bp in fair control at this time  BP Readings from Last 1 Encounters:  12/17/19 132/80   No changes needed Most recent labs reviewed  Disc lifstyle change with low sodium diet and exercise  Refilled lisinopril Labs done

## 2019-12-17 NOTE — Assessment & Plan Note (Signed)
A1C today  Handouts given disc imp of low glycemic diet and wt loss to prevent DM2

## 2019-12-17 NOTE — Progress Notes (Signed)
Subjective:    Patient ID: Clinton Fowler, male    DOB: Jul 19, 1966, 54 y.o.   MRN: [id number]  This visit occurred during the SARS-CoV-2 public health emergency.  Safety protocols were in place, including screening questions prior to the visit, additional usage of staff PPE, and extensive cleaning of exam room while observing appropriate contact time as indicated for disinfecting solutions.    HPI  Here for health maintenance exam and to review chronic medical problems    Wt Readings from Last 3 Encounters:  12/17/19 286 lb 3 oz (129.8 kg)  10/24/18 286 lb 1 oz (129.8 kg)  10/03/17 275 lb 8 oz (125 kg)   44.16 kg/m  Eats smaller portions than he used to    Colon cancer screening Is interested in a colonoscopy   Td 1/07  Wants to update today   Flu shot -did not get this past season  covid imm-had not had yet - considering it   HIV screening - not interested/ low risk  Prostate cancer screening  No symptoms at all No nocturia  Father had prostate cancer in his 63s  Lab Results  Component Value Date   PSA 0.53 10/24/2018   PSA 0.55 09/29/2017   PSA 0.71 08/11/2016    bp is up on first check today  Had some stress this am  No cp or palpitations or headaches or edema  No side effects to medicines  BP Readings from Last 3 Encounters:  12/17/19 (!) 148/82  10/24/18 128/72  10/03/17 130/78    Takes lisinopril 10 mg  2nd check BP: 132/80     Lab Results  Component Value Date   CREATININE 1.05 10/24/2018   BUN 12 10/24/2018   NA 140 10/24/2018   K 4.0 10/24/2018   CL 104 10/24/2018   CO2 26 10/24/2018  ; Due for labs     prediabetes Lab Results  Component Value Date   HGBA1C 6.2 10/24/2018  due for labs   Diet has been about the same  So /so  Makes effort to eat healthy - does eat vegetables and fruit  Sweets- maybe once per week  Bread - not much / but does have sandwiches I summer  No pasta  Not much rice   Snack foods- crackers twice daily  perhaps  No cereal   No diabetes in family   Lots of exercise in farming season  No exercise when not in season  (3 months) -would consider trying walking    Hyperlipidemia Lab Results  Component Value Date   CHOL 134 10/24/2018   HDL 39.00 (L) 10/24/2018   LDLCALC 76 10/24/2018   LDLDIRECT 160.7 09/07/2012   TRIG 92.0 10/24/2018   CHOLHDL 3 10/24/2018  takes atorvastatin  Due for labs  He eats a lot of high cholesterol food   Patient Active Problem List   Diagnosis Date Noted  . Colon cancer screening 12/17/2019  . Hydrocele in adult 08/28/2015  . Routine general medical examination at a health care facility 09/07/2012  . Prediabetes 09/07/2012  . Prostate cancer screening 09/07/2012  . Morbid obesity ([place]) 03/06/2012  . ESSENTIAL HYPERTENSION, BENIGN 10/09/2009  . Hyperlipidemia 10/02/2008   Past Medical History:  Diagnosis Date  . Abnormal transaminases    elevated transaminases (likely fatty liver)  . Biceps tendon rupture    history of biceps tendon rupture bilaterally  . History of gastroesophageal reflux (GERD)   . History of hyperlipidemia   . History of hypertension   .  Obesity    Past Surgical History:  Procedure Laterality Date  . BICEPS TENDON REPAIR     ruptured biceps   Social History   Tobacco Use  . Smoking status: Former Research scientist (life sciences)  . Smokeless tobacco: Never Used  Substance Use Topics  . Alcohol use: No  . Drug use: No   Family History  Problem Relation Age of Onset  . Hypertension Father   . Prostate cancer Father   . Cancer - Other Mother        mouth cancer  . Coronary artery disease Other        Grandmother  . Coronary artery disease Other        Grandfather   No Known Allergies No current outpatient medications on file prior to visit.   No current facility-administered medications on file prior to visit.     Review of Systems  Constitutional: Negative for activity change, appetite change, fatigue, fever and unexpected  weight change.  HENT: Negative for congestion, rhinorrhea, sore throat and trouble swallowing.   Eyes: Negative for pain, redness, itching and visual disturbance.  Respiratory: Negative for cough, chest tightness, shortness of breath and wheezing.   Cardiovascular: Negative for chest pain and palpitations.  Gastrointestinal: Negative for abdominal pain, blood in stool, constipation, diarrhea and nausea.  Endocrine: Negative for cold intolerance, heat intolerance, polydipsia and polyuria.  Genitourinary: Negative for difficulty urinating, dysuria, frequency and urgency.  Musculoskeletal: Negative for arthralgias, joint swelling and myalgias.  Skin: Negative for pallor and rash.  Neurological: Negative for dizziness, tremors, weakness, numbness and headaches.  Hematological: Negative for adenopathy. Does not bruise/bleed easily.  Psychiatric/Behavioral: Negative for decreased concentration and dysphoric mood. The patient is not nervous/anxious.        Objective:   Physical Exam Constitutional:      General: He is not in acute distress.    Appearance: Normal appearance. He is well-developed. He is obese. He is not ill-appearing or diaphoretic.  HENT:     Head: Normocephalic and atraumatic.     Right Ear: Tympanic membrane, ear canal and external ear normal.     Left Ear: Tympanic membrane, ear canal and external ear normal.     Nose: Nose normal. No congestion.     Mouth/Throat:     Mouth: Mucous membranes are moist.     Pharynx: Oropharynx is clear. No posterior oropharyngeal erythema.  Eyes:     General: No scleral icterus.       Right eye: No discharge.        Left eye: No discharge.     Conjunctiva/sclera: Conjunctivae normal.     Pupils: Pupils are equal, round, and reactive to light.  Neck:     Thyroid: No thyromegaly.     Vascular: No carotid bruit or JVD.  Cardiovascular:     Rate and Rhythm: Normal rate and regular rhythm.     Pulses: Normal pulses.     Heart sounds:  Normal heart sounds. No gallop.   Pulmonary:     Effort: Pulmonary effort is normal. No respiratory distress.     Breath sounds: Normal breath sounds. No wheezing or rales.     Comments: Good air exch Chest:     Chest wall: No tenderness.  Abdominal:     General: Bowel sounds are normal. There is no distension or abdominal bruit.     Palpations: Abdomen is soft. There is no mass.     Tenderness: There is no abdominal tenderness.  Hernia: No hernia is present.  Musculoskeletal:        General: No tenderness.     Cervical back: Normal range of motion and neck supple. No rigidity. No muscular tenderness.     Right lower leg: No edema.     Left lower leg: No edema.  Lymphadenopathy:     Cervical: No cervical adenopathy.  Skin:    General: Skin is warm and dry.     Coloration: Skin is not pale.     Findings: No erythema or rash.     Comments: Solar lentigines diffusely Ruddy complexion  Solar aging    Neurological:     Mental Status: He is alert.     Cranial Nerves: No cranial nerve deficit.     Motor: No abnormal muscle tone.     Coordination: Coordination normal.     Gait: Gait normal.     Deep Tendon Reflexes: Reflexes are normal and symmetric. Reflexes normal.  Psychiatric:        Mood and Affect: Mood normal.        Cognition and Memory: Cognition and memory normal.           Assessment & Plan:   Problem List Items Addressed This Visit      Cardiovascular and Mediastinum   ESSENTIAL HYPERTENSION, BENIGN    bp in fair control at this time  BP Readings from Last 1 Encounters:  12/17/19 132/80   No changes needed Most recent labs reviewed  Disc lifstyle change with low sodium diet and exercise  Refilled lisinopril Labs done      Relevant Medications   atorvastatin (LIPITOR) 20 MG tablet   lisinopril (ZESTRIL) 10 MG tablet     Other   Hyperlipidemia    Disc goals for lipids and reasons to control them Rev last labs with pt Rev low sat fat diet in  detail Panel drawn today  Continues low dose atorvastatin       Relevant Medications   atorvastatin (LIPITOR) 20 MG tablet   lisinopril (ZESTRIL) 10 MG tablet   Other Relevant Orders   CBC with Differential/Platelet (Completed)   Comprehensive metabolic panel (Completed)   Lipid panel (Completed)   TSH (Completed)   Morbid obesity ([place])    Discussed how this problem influences overall health and the risks it imposes  Reviewed plan for weight loss with lower calorie diet (via better food choices and also portion control or program like weight watchers) and exercise building up to or more than 30 minutes 5 days per week including some aerobic activity   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  Also cut portions and fatty foods      Routine general medical examination at a health care facility - Primary    Reviewed health habits including diet and exercise and skin cancer prevention Reviewed appropriate screening tests for age  Also reviewed health mt list, fam hx and immunization status , as well as social and family history   See HPI Labs drawn  Td given  Considering covid vaccination later  Colonoscopy referral done  Encourage flu shot in the fall        Relevant Orders   Td : Tetanus/diphtheria >7yo Preservative  free (Completed)   Prediabetes    A1C today  Handouts given disc imp of low glycemic diet and wt loss to prevent DM2       Relevant Orders   Hemoglobin A1c (Completed)  Prostate cancer screening    psa incl in labs Father had prostate cancer in 70s No symptoms       Relevant Orders   PSA (Completed)   Colon cancer screening    Referral done for screening colonoscopy  Pt prefers winter after his work season ends      Relevant Orders   Ambulatory referral to Gastroenterology    Other Visit Diagnoses    Need for Td vaccine       Relevant Orders   Td : Tetanus/diphtheria >7yo Preservative  free (Completed)

## 2019-12-17 NOTE — Assessment & Plan Note (Signed)
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

## 2019-12-18 ENCOUNTER — Encounter: Payer: Self-pay | Admitting: *Deleted

## 2020-12-09 ENCOUNTER — Telehealth: Payer: Self-pay | Admitting: Family Medicine

## 2020-12-09 DIAGNOSIS — E7800 Pure hypercholesterolemia, unspecified: Secondary | ICD-10-CM

## 2020-12-09 DIAGNOSIS — I10 Essential (primary) hypertension: Secondary | ICD-10-CM

## 2020-12-09 DIAGNOSIS — R7303 Prediabetes: Secondary | ICD-10-CM

## 2020-12-09 DIAGNOSIS — Z125 Encounter for screening for malignant neoplasm of prostate: Secondary | ICD-10-CM

## 2020-12-09 NOTE — Telephone Encounter (Signed)
-----   Message from Cloyd Stagers, RT sent at 11/24/2020  9:21 AM EDT ----- Regarding: Lab Orders for Thursday 4.7.2022 Please place lab orders for Thursday 4.7.2022, office visit for physical on Thursday 4.14.2022 Thank you, Dyke Maes RT(R)

## 2020-12-10 ENCOUNTER — Other Ambulatory Visit: Payer: Self-pay

## 2020-12-10 ENCOUNTER — Other Ambulatory Visit (INDEPENDENT_AMBULATORY_CARE_PROVIDER_SITE_OTHER): Payer: BC Managed Care – PPO

## 2020-12-10 DIAGNOSIS — E7800 Pure hypercholesterolemia, unspecified: Secondary | ICD-10-CM | POA: Diagnosis not present

## 2020-12-10 DIAGNOSIS — R7303 Prediabetes: Secondary | ICD-10-CM

## 2020-12-10 DIAGNOSIS — Z125 Encounter for screening for malignant neoplasm of prostate: Secondary | ICD-10-CM

## 2020-12-10 DIAGNOSIS — I10 Essential (primary) hypertension: Secondary | ICD-10-CM

## 2020-12-10 LAB — CBC WITH DIFFERENTIAL/PLATELET
Basophils Absolute: 0 10*3/uL (ref 0.0–0.1)
Basophils Relative: 0.2 % (ref 0.0–3.0)
Eosinophils Absolute: 0.2 10*3/uL (ref 0.0–0.7)
Eosinophils Relative: 2.4 % (ref 0.0–5.0)
HCT: 42.9 % (ref 39.0–52.0)
Hemoglobin: 14.1 g/dL (ref 13.0–17.0)
Lymphocytes Relative: 34.8 % (ref 12.0–46.0)
Lymphs Abs: 2.5 10*3/uL (ref 0.7–4.0)
MCHC: 33 g/dL (ref 30.0–36.0)
MCV: 84.7 fl (ref 78.0–100.0)
Monocytes Absolute: 0.6 10*3/uL (ref 0.1–1.0)
Monocytes Relative: 9 % (ref 3.0–12.0)
Neutro Abs: 3.8 10*3/uL (ref 1.4–7.7)
Neutrophils Relative %: 53.6 % (ref 43.0–77.0)
Platelets: 191 10*3/uL (ref 150.0–400.0)
RBC: 5.06 Mil/uL (ref 4.22–5.81)
RDW: 15.4 % (ref 11.5–15.5)
WBC: 7.1 10*3/uL (ref 4.0–10.5)

## 2020-12-10 LAB — COMPREHENSIVE METABOLIC PANEL
ALT: 26 U/L (ref 0–53)
AST: 18 U/L (ref 0–37)
Albumin: 4.1 g/dL (ref 3.5–5.2)
Alkaline Phosphatase: 46 U/L (ref 39–117)
BUN: 17 mg/dL (ref 6–23)
CO2: 31 mEq/L (ref 19–32)
Calcium: 8.9 mg/dL (ref 8.4–10.5)
Chloride: 104 mEq/L (ref 96–112)
Creatinine, Ser: 0.99 mg/dL (ref 0.40–1.50)
GFR: 87.59 mL/min (ref >60.00)
Glucose, Bld: 107 mg/dL — ABNORMAL HIGH (ref 70–99)
Potassium: 4.4 mEq/L (ref 3.5–5.1)
Sodium: 141 mEq/L (ref 135–145)
Total Bilirubin: 0.5 mg/dL (ref 0.2–1.2)
Total Protein: 6.5 g/dL (ref 6.0–8.3)

## 2020-12-10 LAB — LIPID PANEL
Cholesterol: 125 mg/dL (ref 0–200)
HDL: 36.6 mg/dL — ABNORMAL LOW (ref >39.00)
LDL Cholesterol: 76 mg/dL (ref 0–99)
NonHDL: 88.64
Total CHOL/HDL Ratio: 3
Triglycerides: 63 mg/dL (ref 0.0–149.0)
VLDL: 12.6 mg/dL (ref 0.0–40.0)

## 2020-12-10 LAB — PSA: PSA: 0.54 ng/mL (ref 0.10–4.00)

## 2020-12-10 LAB — TSH: TSH: 1.67 u[IU]/mL (ref 0.35–4.50)

## 2020-12-10 LAB — HEMOGLOBIN A1C: Hgb A1c MFr Bld: 6.3 % (ref 4.6–6.5)

## 2020-12-17 ENCOUNTER — Encounter: Payer: Self-pay | Admitting: Family Medicine

## 2020-12-17 ENCOUNTER — Ambulatory Visit (INDEPENDENT_AMBULATORY_CARE_PROVIDER_SITE_OTHER): Payer: BC Managed Care – PPO | Admitting: Family Medicine

## 2020-12-17 ENCOUNTER — Other Ambulatory Visit: Payer: Self-pay

## 2020-12-17 VITALS — BP 134/78 | HR 65 | Temp 96.9°F | Ht 67.0 in | Wt 286.1 lb

## 2020-12-17 DIAGNOSIS — I10 Essential (primary) hypertension: Secondary | ICD-10-CM | POA: Diagnosis not present

## 2020-12-17 DIAGNOSIS — Z0000 Encounter for general adult medical examination without abnormal findings: Secondary | ICD-10-CM

## 2020-12-17 DIAGNOSIS — R7303 Prediabetes: Secondary | ICD-10-CM

## 2020-12-17 DIAGNOSIS — Z1211 Encounter for screening for malignant neoplasm of colon: Secondary | ICD-10-CM | POA: Diagnosis not present

## 2020-12-17 DIAGNOSIS — E7800 Pure hypercholesterolemia, unspecified: Secondary | ICD-10-CM | POA: Diagnosis not present

## 2020-12-17 DIAGNOSIS — Z125 Encounter for screening for malignant neoplasm of prostate: Secondary | ICD-10-CM

## 2020-12-17 MED ORDER — OMEPRAZOLE 20 MG PO CPDR: 20.0000 mg | DELAYED_RELEASE_CAPSULE | Freq: Every day | ORAL | 3 refills | Status: DC

## 2020-12-17 MED ORDER — ATORVASTATIN CALCIUM 20 MG PO TABS: ORAL_TABLET | ORAL | 3 refills | Status: DC

## 2020-12-17 MED ORDER — LISINOPRIL 10 MG PO TABS: ORAL_TABLET | ORAL | 3 refills | Status: DC

## 2020-12-17 NOTE — Assessment & Plan Note (Signed)
bp in fair control at this time  BP Readings from Last 1 Encounters:  12/17/20 134/78   No changes needed Most recent labs reviewed  Disc lifstyle change with low sodium diet and exercise  Plan to continue lisinopril 10 mg daily

## 2020-12-17 NOTE — Progress Notes (Signed)
Subjective:    Patient ID: Clinton Fowler, male    DOB: February 10, 1966, 55 y.o.   MRN: [id number]  This visit occurred during the SARS-CoV-2 public health emergency.  Safety protocols were in place, including screening questions prior to the visit, additional usage of staff PPE, and extensive cleaning of exam room while observing appropriate contact time as indicated for disinfecting solutions.    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   Feeling pretty good  Taking care of himself    Colon cancer screening  Had tried a referral but it did not work out  Better to do in the fall of the year  Will call us for referral then    covid status - had covid sept  Wasn't too bad  Decided not to vaccinate    Flu shot- did not get this fall  Td 4/21  Prostate health  Lab Results  Component Value Date   PSA 0.54 12/10/2020   PSA 0.44 12/17/2019   PSA 0.53 10/24/2018  father had prostate cancer - elderly He has no symptoms  No problems voiding  Nocturia seldom to none      HTN bp is stable today  No cp or palpitations or headaches or edema  No side effects to medicines  BP Readings from Last 3 Encounters:  12/17/20 134/78  12/17/19 132/80  10/24/18 128/72     Takes lisinopril 10 mg daily   Pulse Readings from Last 3 Encounters:  12/17/20 65  12/17/19 78  10/24/18 81   Hyperlipidemia Lab Results  Component Value Date   CHOL 125 12/10/2020   CHOL 130 12/17/2019   CHOL 134 10/24/2018   Lab Results  Component Value Date   HDL 36.60 (L) 12/10/2020   HDL 35.20 (L) 12/17/2019   HDL 39.00 (L) 10/24/2018   Lab Results  Component Value Date   LDLCALC 76 12/10/2020   LDLCALC 78 12/17/2019   LDLCALC 76 10/24/2018   Lab Results  Component  Value Date   TRIG 63.0 12/10/2020   TRIG 80.0 12/17/2019   TRIG 92.0 10/24/2018   Lab Results  Component Value Date   CHOLHDL 3 12/10/2020   CHOLHDL 4 12/17/2019   CHOLHDL 3 10/24/2018   Lab Results  Component Value Date   LDLDIRECT 160.7 09/07/2012   LDLDIRECT 157.4 03/06/2012   LDLDIRECT 161.6 10/09/2009  atorvastatin 10 mg daily  Lots of exercise with farming   Prediabetes Lab Results  Component Value Date   HGBA1C 6.3 12/10/2020   This is stable from a year ago Drinking more water and less sweet tea   Lab Results  Component Value Date   WBC 7.1 12/10/2020   HGB 14.1 12/10/2020   HCT 42.9 12/10/2020   MCV 84.7 12/10/2020   PLT 191.0 12/10/2020   Lab Results  Component Value Date   CREATININE 0.99 12/10/2020   BUN 17 12/10/2020   NA 141 12/10/2020   K 4.4 12/10/2020   CL 104 12/10/2020   CO2 31 12/10/2020   Lab Results  Component Value Date   ALT 26 12/10/2020   AST 18 12/10/2020   ALKPHOS 46 12/10/2020   BILITOT 0.5 12/10/2020   Lab  Results  Component Value Date   TSH 1.67 12/10/2020    Patient Active Problem List   Diagnosis Date Noted  . Colon cancer screening 12/17/2019  . Hydrocele in adult 08/28/2015  . Routine general medical examination at a health care facility 09/07/2012  . Prediabetes 09/07/2012  . Prostate cancer screening 09/07/2012  . Morbid obesity ([place]) 03/06/2012  . ESSENTIAL HYPERTENSION, BENIGN 10/09/2009  . Hyperlipidemia 10/02/2008   Past Medical History:  Diagnosis Date  . Abnormal transaminases    elevated transaminases (likely fatty liver)  . Biceps tendon rupture    history of biceps tendon rupture bilaterally  . History of gastroesophageal reflux (GERD)   . History of hyperlipidemia   . History of hypertension   . Obesity    Past Surgical History:  Procedure Laterality Date  . BICEPS TENDON REPAIR     ruptured biceps   Social History   Tobacco Use  . Smoking status: Former Research scientist (life sciences)  . Smokeless tobacco:  Never Used  Substance Use Topics  . Alcohol use: No  . Drug use: No   Family History  Problem Relation Age of Onset  . Hypertension Father   . Prostate cancer Father   . Cancer - Other Mother        mouth cancer  . Coronary artery disease Other        Grandmother  . Coronary artery disease Other        Grandfather   No Known Allergies No current outpatient medications on file prior to visit.   No current facility-administered medications on file prior to visit.    Review of Systems  Constitutional: Negative for activity change, appetite change, fatigue, fever and unexpected weight change.  HENT: Negative for congestion, rhinorrhea, sore throat and trouble swallowing.   Eyes: Negative for pain, redness, itching and visual disturbance.  Respiratory: Negative for cough, chest tightness, shortness of breath and wheezing.   Cardiovascular: Negative for chest pain and palpitations.  Gastrointestinal: Negative for abdominal pain, blood in stool, constipation, diarrhea and nausea.  Endocrine: Negative for cold intolerance, heat intolerance, polydipsia and polyuria.  Genitourinary: Negative for difficulty urinating, dysuria, frequency and urgency.  Musculoskeletal: Negative for arthralgias, joint swelling and myalgias.  Skin: Negative for pallor and rash.  Neurological: Negative for dizziness, tremors, weakness, numbness and headaches.  Hematological: Negative for adenopathy. Does not bruise/bleed easily.  Psychiatric/Behavioral: Negative for decreased concentration and dysphoric mood. The patient is not nervous/anxious.        Objective:   Physical Exam Constitutional:      General: He is not in acute distress.    Appearance: Normal appearance. He is well-developed. He is obese. He is not ill-appearing or diaphoretic.  HENT:     Head: Normocephalic and atraumatic.     Right Ear: Tympanic membrane, ear canal and external ear normal.     Left Ear: Tympanic membrane, ear canal and  external ear normal.     Nose: Nose normal. No congestion.     Mouth/Throat:     Mouth: Mucous membranes are moist.     Pharynx: Oropharynx is clear. No posterior oropharyngeal erythema.  Eyes:     General: No scleral icterus.       Right eye: No discharge.        Left eye: No discharge.     Conjunctiva/sclera: Conjunctivae normal.     Pupils: Pupils are equal, round, and reactive to light.  Neck:     Thyroid: No thyromegaly.  Vascular: No carotid bruit or JVD.  Cardiovascular:     Rate and Rhythm: Normal rate and regular rhythm.     Pulses: Normal pulses.     Heart sounds: Normal heart sounds. No gallop.   Pulmonary:     Effort: Pulmonary effort is normal. No respiratory distress.     Breath sounds: Normal breath sounds. No wheezing or rales.     Comments: Good air exch Chest:     Chest wall: No tenderness.  Abdominal:     General: Bowel sounds are normal. There is no distension or abdominal bruit.     Palpations: Abdomen is soft. There is no mass.     Tenderness: There is no abdominal tenderness.     Hernia: No hernia is present.     Comments: Obese abdomen  Musculoskeletal:        General: No tenderness.     Cervical back: Normal range of motion and neck supple. No rigidity. No muscular tenderness.     Right lower leg: No edema.     Left lower leg: No edema.  Lymphadenopathy:     Cervical: No cervical adenopathy.  Skin:    General: Skin is warm and dry.     Coloration: Skin is not pale.     Findings: No erythema or rash.     Comments: Solar lentigines diffusely Solar aging- neck/face and forearms   Neurological:     Mental Status: He is alert.     Cranial Nerves: No cranial nerve deficit.     Motor: No abnormal muscle tone.     Coordination: Coordination normal.     Gait: Gait normal.     Deep Tendon Reflexes: Reflexes are normal and symmetric. Reflexes normal.  Psychiatric:        Mood and Affect: Mood normal.        Cognition and Memory: Cognition and  memory normal.           Assessment & Plan:   Problem List Items Addressed This Visit      Cardiovascular and Mediastinum   ESSENTIAL HYPERTENSION, BENIGN    bp in fair control at this time  BP Readings from Last 1 Encounters:  12/17/20 134/78   No changes needed Most recent labs reviewed  Disc lifstyle change with low sodium diet and exercise  Plan to continue lisinopril 10 mg daily      Relevant Medications   atorvastatin (LIPITOR) 20 MG tablet   lisinopril (ZESTRIL) 10 MG tablet     Other   Hyperlipidemia    Disc goals for lipids and reasons to control them Rev last labs with pt Rev low sat fat diet in detail Good control with atorvasatin 10 mg daily  HDL remains mildly low      Relevant Medications   atorvastatin (LIPITOR) 20 MG tablet   lisinopril (ZESTRIL) 10 MG tablet   Morbid obesity ([facility])    Discussed how this problem influences overall health and the risks it imposes  Reviewed plan for weight loss with lower calorie diet (via better food choices and also portion control or program like weight watchers) and exercise building up to or more than 30 minutes 5 days per week including some aerobic activity   Offered ref to healthy weight clinic if interested       Routine general medical examination at a health care facility - Primary    Reviewed health habits including diet and exercise and skin cancer prevention Reviewed appropriate screening tests  for age  Also reviewed health mt list, fam hx and immunization status , as well as social and family history   See HPI  Labs reviewed  Pt will call for colonoscopy ref in late summer  Declines covid vaccination  psa is stable        Prediabetes    Stable Lab Results  Component Value Date   HGBA1C 6.3 12/10/2020   disc imp of low glycemic diet and wt loss to prevent DM2  He also has a physical job- this is helpful      Prostate cancer screening    Lab Results  Component Value Date   PSA 0.54  12/10/2020   PSA 0.44 12/17/2019   PSA 0.53 10/24/2018    Reassuring  No clinical changes Father had prostate cancer at a late age      Colon cancer screening    Pt wants to do colonoscopy in the fall  He will call for referral in late summer

## 2020-12-17 NOTE — Assessment & Plan Note (Signed)
Lab Results  Component Value Date   PSA 0.54 12/10/2020   PSA 0.44 12/17/2019   PSA 0.53 10/24/2018    Reassuring  No clinical changes Father had prostate cancer at a late age

## 2020-12-17 NOTE — Assessment & Plan Note (Signed)
Reviewed health habits including diet and exercise and skin cancer prevention Reviewed appropriate screening tests for age  Also reviewed health mt list, fam hx and immunization status , as well as social and family history   See HPI  Labs reviewed  Pt will call for colonoscopy ref in late summer  Declines covid vaccination  psa is stable

## 2020-12-17 NOTE — Patient Instructions (Addendum)
Call us in late summer and we will put a referral in for a colonoscopy in the fall    Continue drinking water instead of sweet tea  Try to get most of your carbohydrates from produce (with the exception of white potatoes)  Eat less bread/pasta/rice/snack foods/cereals/sweets and other items from the middle of the grocery store (processed carbs)  Let us know if you want to [facility] for help loosing weight

## 2020-12-17 NOTE — Assessment & Plan Note (Signed)
Pt wants to do colonoscopy in the fall  He will call for referral in late summer

## 2020-12-17 NOTE — Assessment & Plan Note (Signed)
Discussed how this problem influences overall health and the risks it imposes  Reviewed plan for weight loss with lower calorie diet (via better food choices and also portion control or program like weight watchers) and exercise building up to or more than 30 minutes 5 days per week including some aerobic activity   Offered ref to healthy weight clinic if interested

## 2020-12-17 NOTE — Assessment & Plan Note (Signed)
Stable Lab Results  Component Value Date   HGBA1C 6.3 12/10/2020   disc imp of low glycemic diet and wt loss to prevent DM2  He also has a physical job- this is helpful

## 2020-12-17 NOTE — Assessment & Plan Note (Signed)
Disc goals for lipids and reasons to control them Rev last labs with pt Rev low sat fat diet in detail Good control with atorvasatin 10 mg daily  HDL remains mildly low

## 2021-10-08 ENCOUNTER — Other Ambulatory Visit: Payer: Self-pay

## 2021-10-08 ENCOUNTER — Ambulatory Visit
Admission: EM | Admit: 2021-10-08 | Discharge: 2021-10-08 | Disposition: A | Discharge status: Home / Self Care | Payer: BC Managed Care – PPO | Attending: Family Medicine | Admitting: Family Medicine

## 2021-10-08 DIAGNOSIS — J209 Acute bronchitis, unspecified: Secondary | ICD-10-CM | POA: Diagnosis not present

## 2021-10-08 DIAGNOSIS — R062 Wheezing: Secondary | ICD-10-CM

## 2021-10-08 MED ORDER — PROMETHAZINE-DM 6.25-15 MG/5ML PO SYRP: 5.0000 mL | ORAL_SOLUTION | Freq: Four times a day (QID) | ORAL | 0 refills | Status: DC | PRN

## 2021-10-08 MED ORDER — ALBUTEROL SULFATE HFA 108 (90 BASE) MCG/ACT IN AERS: 1.0000 | INHALATION_SPRAY | Freq: Four times a day (QID) | RESPIRATORY_TRACT | 0 refills | Status: DC | PRN

## 2021-10-08 MED ORDER — PREDNISONE 20 MG PO TABS: 40.0000 mg | ORAL_TABLET | Freq: Every day | ORAL | 0 refills | Status: DC

## 2021-10-08 MED ORDER — DOXYCYCLINE HYCLATE 100 MG PO CAPS: 100.0000 mg | ORAL_CAPSULE | Freq: Two times a day (BID) | ORAL | 0 refills | Status: DC

## 2021-10-08 MED ORDER — ALBUTEROL SULFATE HFA 108 (90 BASE) MCG/ACT IN AERS
2.0000 | INHALATION_SPRAY | Freq: Once | RESPIRATORY_TRACT | Status: AC
  Administered 2021-10-08: 2 via RESPIRATORY_TRACT

## 2021-10-08 NOTE — ED Provider Notes (Addendum)
Roderic Palau    CSN: [id number] Arrival date & time: 10/08/21  1713      History   Chief Complaint Chief Complaint  Patient presents with   Cough    HPI Clinton Fowler is a 56 y.o. male.   HPI Patient presents today with 2 to 3-day history of persistent cough which is occasionally productive, wheezing and shortness of breath.  Patient also has a low-grade temperature on arrival here today.  He endorses today he noted shortness of breath with exertional movements.  Patient is a farmer and routinely very active and today he felt the symptoms have worsened.  He is unaware of any known exposure to COVID or flu.  He began to take Delsym for cough management today without relief.  He has no history of asthma or chronic bronchitis.  He is a non-smoker. Past Medical History:  Diagnosis Date   Abnormal transaminases    elevated transaminases (likely fatty liver)   Biceps tendon rupture    history of biceps tendon rupture bilaterally   History of gastroesophageal reflux (GERD)    History of hyperlipidemia    History of hypertension    Obesity     Patient Active Problem List   Diagnosis Date Noted   Colon cancer screening 12/17/2019   Hydrocele in adult 08/28/2015   Routine general medical examination at a health care facility 09/07/2012   Prediabetes 09/07/2012   Prostate cancer screening 09/07/2012   Morbid obesity ([place]) 03/06/2012   ESSENTIAL HYPERTENSION, BENIGN 10/09/2009   Hyperlipidemia 10/02/2008    Past Surgical History:  Procedure Laterality Date   APPENDECTOMY     BICEPS TENDON REPAIR     ruptured biceps       Home Medications    Prior to Admission medications   Medication Sig Start Date End Date Taking? Authorizing Provider  albuterol (VENTOLIN HFA) 108 (90 Base) MCG/ACT inhaler Inhale 1-2 puffs into the lungs every 6 (six) hours as needed for wheezing or shortness of breath. 10/08/21  Yes Scot Jun, FNP  atorvastatin (LIPITOR) 20 MG tablet  TAKE 1 TABLET BY MOUTH EVERY DAY 12/17/20  Yes Tower, Wynelle Fanny, MD  doxycycline (VIBRAMYCIN) 100 MG capsule Take 1 capsule (100 mg total) by mouth 2 (two) times daily. 10/08/21  Yes Scot Jun, FNP  lisinopril (ZESTRIL) 10 MG tablet TAKE 1 TABLET BY MOUTH EVERY DAY 12/17/20  Yes Tower, Wynelle Fanny, MD  omeprazole (PRILOSEC) 20 MG capsule Take 1 capsule (20 mg total) by mouth daily. 12/17/20  Yes Tower, Wynelle Fanny, MD  predniSONE (DELTASONE) 20 MG tablet Take 2 tablets (40 mg total) by mouth daily with breakfast. 10/08/21  Yes Scot Jun, FNP  promethazine-dextromethorphan (PROMETHAZINE-DM) 6.25-15 MG/5ML syrup Take 5 mLs by mouth 4 (four) times daily as needed for cough. 10/08/21  Yes Scot Jun, FNP    Family History Family History  Problem Relation Age of Onset   Hypertension Father    Prostate cancer Father    Cancer - Other Mother        mouth cancer   Coronary artery disease Other        Grandmother   Coronary artery disease Other        Grandfather    Social History Social History   Tobacco Use   Smoking status: Never   Smokeless tobacco: Never  Vaping Use   Vaping Use: Never used  Substance Use Topics   Alcohol use: Not Currently   Drug  use: No     Allergies   Patient has no known allergies.   Review of Systems Review of Systems Pertinent negatives listed in HPI   Physical Exam Triage Vital Signs ED Triage Vitals  Enc Vitals Group     BP 10/08/21 1726 (!) 147/88     Pulse Rate 10/08/21 1726 (!) 102     Resp 10/08/21 1726 20     Temp 10/08/21 1726 99.1 F (37.3 C)     Temp Source 10/08/21 1726 Oral     SpO2 10/08/21 1726 95 %     Weight 10/08/21 1727 290 lb (131.5 kg)     Height 10/08/21 1727 5\' 7"  (1.702 m)     Head Circumference --      Peak Flow --      Pain Score 10/08/21 1727 0     Pain Loc --      Pain Edu? --      Excl. in [place]? --    No data found.  Updated Vital Signs BP (!) 147/88 (BP Location: Left Arm)    Pulse (!) 102    Temp  99.1 F (37.3 C) (Oral)    Resp 20    Ht 5\' 7"  (1.702 m)    Wt 290 lb (131.5 kg)    SpO2 95%    BMI 45.42 kg/m   Visual Acuity Right Eye Distance:   Left Eye Distance:   Bilateral Distance:    Right Eye Near:   Left Eye Near:    Bilateral Near:     Physical Exam Constitutional:      Appearance: He is obese. He is ill-appearing.  HENT:     Head: Normocephalic and atraumatic.     Nose: Rhinorrhea present.     Mouth/Throat:     Pharynx: No oropharyngeal exudate or posterior oropharyngeal erythema.  Eyes:     Extraocular Movements: Extraocular movements intact.     Pupils: Pupils are equal, round, and reactive to light.  Cardiovascular:     Rate and Rhythm: Regular rhythm. Tachycardia present.     Pulses: Normal pulses.     Heart sounds: Normal heart sounds.  Pulmonary:     Breath sounds: Wheezing and rhonchi present.  Musculoskeletal:        General: Normal range of motion.     Cervical back: Normal range of motion and neck supple.  Skin:    General: Skin is warm and dry.     Capillary Refill: Capillary refill takes less than 2 seconds.  Neurological:     Mental Status: He is alert and oriented to person, place, and time.  Psychiatric:        Mood and Affect: Mood normal.        Behavior: Behavior normal.        Thought Content: Thought content normal.        Judgment: Judgment normal.     UC Treatments / Results  Labs (all labs ordered are listed, but only abnormal results are displayed) Labs Reviewed  COVID-19, FLU A+B NAA    EKG   Radiology No results found.  Procedures Procedures (including critical care time)  Medications Ordered in UC Medications  albuterol (VENTOLIN HFA) 108 (90 Base) MCG/ACT inhaler 2 puff (2 puffs Inhalation Given 10/08/21 1748)    Initial Impression / Assessment and Plan / UC Course  I have reviewed the triage vital signs and the nursing notes.  Pertinent labs & imaging results that were available during  my care of the  patient were reviewed by me and considered in my medical decision making (see chart for details).    COVID/Flu test pending.  Treating for acute bronchitis and wheezing. Unable to rule out illness related to viral illness therefore COVID/Flu test is pending. Treatment per discharge medication orders. Manage fever with Tylenol and ibuprofen.   Treatment per discharge medications/discharge instructions.  Red flags/ER precautions given.  The most current CDC isolation/quarantine recommendation advised.   Final Clinical Impressions(s) / UC Diagnoses   Final diagnoses:  Acute bronchitis, unspecified organism  Wheezing     Discharge Instructions      Your COVID test should result within 3 days. We will treat you for bronchitis, doxycycline 100 mg twice daily for 10 days.  To help with your wheezing and improve your breathing prednisone 40 mg once daily with breakfast for 5 days. Albuterol inhaler 2 puffs every 6 hours as needed for wheezing and shortness of breath. Promethazine DM 5 mL every 4-6 hours as needed for cough. If any your symptoms worsen or become severe go immediately to the nearest emergency department.     ED Prescriptions     Medication Sig Dispense Auth. Provider   albuterol (VENTOLIN HFA) 108 (90 Base) MCG/ACT inhaler Inhale 1-2 puffs into the lungs every 6 (six) hours as needed for wheezing or shortness of breath. 1 each Scot Jun, FNP   predniSONE (DELTASONE) 20 MG tablet Take 2 tablets (40 mg total) by mouth daily with breakfast. 10 tablet Scot Jun, FNP   doxycycline (VIBRAMYCIN) 100 MG capsule Take 1 capsule (100 mg total) by mouth 2 (two) times daily. 20 capsule Scot Jun, FNP   promethazine-dextromethorphan (PROMETHAZINE-DM) 6.25-15 MG/5ML syrup Take 5 mLs by mouth 4 (four) times daily as needed for cough. 180 mL Scot Jun, FNP      PDMP not reviewed this encounter.   Scot Jun, FNP 10/08/21 1758    Scot Jun, FNP 10/08/21 1800

## 2021-10-08 NOTE — Discharge Instructions (Signed)
Your COVID test should result within 3 days. We will treat you for bronchitis, doxycycline 100 mg twice daily for 10 days.  To help with your wheezing and improve your breathing prednisone 40 mg once daily with breakfast for 5 days. Albuterol inhaler 2 puffs every 6 hours as needed for wheezing and shortness of breath. Promethazine DM 5 mL every 4-6 hours as needed for cough. If any your symptoms worsen or become severe go immediately to the nearest emergency department.

## 2021-10-08 NOTE — ED Triage Notes (Addendum)
Pt reports productive cough with clear phlegm. Reports SOBOE as well, noticed today.  Denies CP, Denies sinuses issues, sore throat or ear aches.   OTC IBU for shoulder aches (not new) OTC [place] cough syrup.   No home COVID testing

## 2021-10-09 LAB — COVID-19, FLU A+B NAA
Influenza A, NAA: NOT DETECTED
Influenza B, NAA: NOT DETECTED
SARS-CoV-2, NAA: NOT DETECTED

## 2021-11-08 ENCOUNTER — Ambulatory Visit (INDEPENDENT_AMBULATORY_CARE_PROVIDER_SITE_OTHER)
Admission: RE | Admit: 2021-11-08 | Discharge: 2021-11-08 | Disposition: A | Discharge status: Home / Self Care | Payer: BC Managed Care – PPO | Source: Ambulatory Visit | Attending: Family Medicine | Admitting: Family Medicine

## 2021-11-08 ENCOUNTER — Encounter: Payer: Self-pay | Admitting: Family Medicine

## 2021-11-08 ENCOUNTER — Other Ambulatory Visit: Payer: Self-pay

## 2021-11-08 ENCOUNTER — Ambulatory Visit: Payer: BC Managed Care – PPO | Admitting: Family Medicine

## 2021-11-08 ENCOUNTER — Telehealth: Payer: Self-pay | Admitting: Family Medicine

## 2021-11-08 VITALS — BP 130/80 | HR 84 | Temp 98.8°F | Ht 67.0 in | Wt 292.1 lb

## 2021-11-08 DIAGNOSIS — B349 Viral infection, unspecified: Secondary | ICD-10-CM | POA: Diagnosis not present

## 2021-11-08 DIAGNOSIS — M160 Bilateral primary osteoarthritis of hip: Secondary | ICD-10-CM

## 2021-11-08 DIAGNOSIS — M01X0 Direct infection of unspecified joint in infectious and parasitic diseases classified elsewhere: Secondary | ICD-10-CM

## 2021-11-08 MED ORDER — MELOXICAM 15 MG PO TABS: 15.0000 mg | ORAL_TABLET | Freq: Every day | ORAL | 2 refills | Status: DC

## 2021-11-08 NOTE — Telephone Encounter (Signed)
I have no idea what this would be about, since I talked to him about his hip pain. ? ?Can you check and see what he needs/wants specifically? ?

## 2021-11-08 NOTE — Progress Notes (Signed)
? ? ?Shawndale Kilpatrick T. Olin Gurski, MD, [facility] ?Therapist, music at [facility] ?[place] ?[place], [address] ? ?Phone: [phone]  FAX: [phone] ? ?Clinton Fowler - 56 y.o. male  MRN [id number]  Date of Birth: 12/07/1965 ? ?Date: 11/08/2021  PCP: Abner Greenspan, MD  Referral: Abner Greenspan, MD ? ?Chief Complaint  ?Patient presents with  ? Hip Pain  ?  Bilateral-Left is worse ?  ? ? ?This visit occurred during the SARS-CoV-2 public health emergency.  Safety protocols were in place, including screening questions prior to the visit, additional usage of staff PPE, and extensive cleaning of exam room while observing appropriate contact time as indicated for disinfecting solutions.  ? ?Subjective:  ? ?GRAHM ETSITTY is a 56 y.o. very pleasant male patient with Body mass index is 45.74 kg/m?. who presents with the following: ? ?Presents with B hip pain: 1 month ago - sick, hip pain started after then. ? ?He is a pleasant gentleman, and I have known him and his family for many years.  Primarily now he presents with some bilateral hip pain.  While he is has had some mild hip pain, over the last month it has been quite bad and limiting.  At this point, he is having some difficulty putting on his socks. ? ?The only significant event that happened prior to this was an acute viral illness. ? ?He denies any significant back pain.  He has had some remittent shoulder pain, but he has not had this any specific change.  He denies any swelling or pain in his hands, fingers, or digits of the foot. ? ?He has been trying some intermittent Tylenol or ibuprofen.b ? ?Tobacco farmer and soybeans.  ? ?No systemic symptoms.  ? ?Will take Alleve 440 mg at night sometimes, but this is intermittent. ? ? ?Review of Systems is noted in the HPI, as appropriate ? ?Objective:  ? ?BP 130/80   Pulse 84   Temp 98.8 ?F (37.1 ?C) (Oral)   Ht '5\' 7"'$  (1.702 m)   Wt 292 lb 1 oz (132.5 kg)   SpO2 98%   BMI 45.74 kg/m?   ? ?GEN: No acute distress; alert,appropriate. ?PULM: Breathing comfortably in no respiratory distress ?PSYCH: Normally interactive.  ? ? ?HIP EXAM: SIDE: Bilateral ?ROM: Abduction, Flexion, Internal and External range of motion: He is limited to roughly 25 degrees of motion with the hip flexed to 90 degrees ? ?Pain with terminal IROM and EROM: In all directions ?GTB: NT ?SLR: NEG ?Knees: No effusion ?FABER: NT ?REVERSE FABER: NT, neg ?Piriformis: NT at direct palpation ?Str: flexion: 5/5 ?abduction: 5/5 ?adduction: 5/5 ?Strength testing non-tender ?  ? ?Laboratory and Imaging Data: ?DG Hip Unilat W OR W/O Pelvis 2-3 Views Left ? ?Result Date: 11/08/2021 ?CLINICAL DATA:  Osteoarthritis. Bilateral hip pain for several months. EXAM: DG HIP (WITH OR WITHOUT PELVIS) 2-3V RIGHT; DG HIP (WITH OR WITHOUT PELVIS) 2-3V LEFT COMPARISON:  None. FINDINGS: Moderate bilateral femoroacetabular joint space narrowing. Mild bilateral femoroacetabular subchondral sclerosis. Mild bilateral sacroiliac subchondral sclerosis degenerative change. The pubic symphysis joint space is maintained. No acute fracture or dislocation. IMPRESSION: Mild bilateral femoroacetabular and mild bilateral sacroiliac joint osteoarthritis. Electronically Signed   By: Yvonne Kendall M.D.   On: 11/08/2021 16:18  ? ?DG Hip Unilat W OR W/O Pelvis 2-3 Views Right ? ?Result Date: 11/08/2021 ?CLINICAL DATA:  Osteoarthritis. Bilateral hip pain for several months. EXAM: DG HIP (WITH OR WITHOUT PELVIS) 2-3V  RIGHT; DG HIP (WITH OR WITHOUT PELVIS) 2-3V LEFT COMPARISON:  None. FINDINGS: Moderate bilateral femoroacetabular joint space narrowing. Mild bilateral femoroacetabular subchondral sclerosis. Mild bilateral sacroiliac subchondral sclerosis degenerative change. The pubic symphysis joint space is maintained. No acute fracture or dislocation. IMPRESSION: Mild bilateral femoroacetabular and mild bilateral sacroiliac joint osteoarthritis. Electronically Signed   By: Yvonne Kendall M.D.   On: 11/08/2021 16:18    ? ?Assessment and Plan:  ? ?  ICD-10-CM   ?1. Viral arthritis (HCC)  B34.9   ? M01.X0   ?  ?2. Primary localized osteoarthritis of both hips  M16.0 DG Hip Unilat W OR W/O Pelvis 2-3 Views Right  ?  DG Hip Unilat W OR W/O Pelvis 2-3 Views Left  ?  ? ?Correlation of worsened hip pain does go along with roughly the time of his viral syndrome.  This is most likely a postviral arthritic component as a primary driver.  Certainly does have some chronic arthritis, and certainly there would be a component of some acute flare. ? ?We reviewed his films, he does have some mild bilateral osteoarthritis. ? ?Typically, postviral arthritis does resolve on the matter of weeks.  I want to place him on some scheduled anti-inflammatories right now, and I am going to have him start some Mobic 15 mg. ? ?If symptoms persist after 2 weeks, I think a pulse of some oral steroids would be reasonable. ? ?Meds ordered this encounter  ?Medications  ? meloxicam (MOBIC) 15 MG tablet  ?  Sig: Take 1 tablet (15 mg total) by mouth daily.  ?  Dispense:  30 tablet  ?  Refill:  2  ? ?Medications Discontinued During This Encounter  ?Medication Reason  ? doxycycline (VIBRAMYCIN) 100 MG capsule Completed Course  ? albuterol (VENTOLIN HFA) 108 (90 Base) MCG/ACT inhaler No longer needed (for PRN medications)  ? predniSONE (DELTASONE) 20 MG tablet Completed Course  ? promethazine-dextromethorphan (PROMETHAZINE-DM) 6.25-15 MG/5ML syrup Completed Course  ? ?Orders Placed This Encounter  ?Procedures  ? DG Hip Unilat W OR W/O Pelvis 2-3 Views Right  ? DG Hip Unilat W OR W/O Pelvis 2-3 Views Left  ? ? ?Follow-up: No follow-ups on file. ? ?Dragon Medical One speech-to-text software was used for transcription in this dictation.  Possible transcriptional errors can occur using Editor, commissioning.  ? ?Signed, ? ?Jazzma Neidhardt T. Fabiola Mudgett, MD ? ? ?Outpatient Encounter Medications as of 11/08/2021  ?Medication Sig  ? meloxicam (MOBIC) 15 MG  tablet Take 1 tablet (15 mg total) by mouth daily.  ? atorvastatin (LIPITOR) 20 MG tablet TAKE 1 TABLET BY MOUTH EVERY DAY  ? lisinopril (ZESTRIL) 10 MG tablet TAKE 1 TABLET BY MOUTH EVERY DAY  ? omeprazole (PRILOSEC) 20 MG capsule Take 1 capsule (20 mg total) by mouth daily.  ? [DISCONTINUED] albuterol (VENTOLIN HFA) 108 (90 Base) MCG/ACT inhaler Inhale 1-2 puffs into the lungs every 6 (six) hours as needed for wheezing or shortness of breath.  ? [DISCONTINUED] doxycycline (VIBRAMYCIN) 100 MG capsule Take 1 capsule (100 mg total) by mouth 2 (two) times daily.  ? [DISCONTINUED] predniSONE (DELTASONE) 20 MG tablet Take 2 tablets (40 mg total) by mouth daily with breakfast.  ? [DISCONTINUED] promethazine-dextromethorphan (PROMETHAZINE-DM) 6.25-15 MG/5ML syrup Take 5 mLs by mouth 4 (four) times daily as needed for cough.  ? ?No facility-administered encounter medications on file as of 11/08/2021.  ?  ?

## 2021-11-08 NOTE — Telephone Encounter (Signed)
Patient saw Dr Lorelei Pont today. Will send to him to review not sure if this was addressed. ?

## 2021-11-08 NOTE — Telephone Encounter (Signed)
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 Did not provide any paperwork at this time, unsure if just a letter is needed. ? ?Please follow-up with the patient at [phone] ?

## 2021-11-09 NOTE — Telephone Encounter (Signed)
Spoke with Clinton Fowler for clarification.  He states he needs a letter stating he is okay, heath wise, to undergo fit testing.  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  He states Dr. Glori Bickers has written this letter for him in the past.  Will forward message to PCP to write letter.  ?

## 2021-11-09 NOTE — Telephone Encounter (Signed)
Done and in IN box 

## 2021-11-09 NOTE — Telephone Encounter (Signed)
Pt notified form ready for pick up 

## 2021-11-19 ENCOUNTER — Telehealth: Payer: Self-pay | Admitting: Family Medicine

## 2021-11-19 NOTE — Telephone Encounter (Signed)
Pt called stating that the pills that [facility] prescribe wasn't helping. Pt states that he still can't hardly walk. ?

## 2021-11-22 MED ORDER — PREDNISONE 20 MG PO TABS: ORAL_TABLET | ORAL | 0 refills | Status: DC

## 2021-11-22 NOTE — Telephone Encounter (Signed)
Pt made aware of RX sent in and instructions of how to take.  ?

## 2021-11-22 NOTE — Telephone Encounter (Signed)
I want to see if a round of some prednisone will calm both of his hips down acutely. ?

## 2021-11-22 NOTE — Addendum Note (Signed)
Addended by: Owens Loffler on: 11/22/2021 01:20 PM ? ? Modules accepted: Orders ? ?

## 2021-11-22 NOTE — Telephone Encounter (Signed)
Spoke to patient by telephone and was advised that the Meloxicam is not helping his pain at all. ?Patient stated that he has been taking one tylenol and two aleve in the mornings which seems to help some and the meloxicam at night. Patient stated that the pain in his left leg is worse than the right. 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 ?Patient wants to know what else would be recommended for the pain that he is having. ?Pharmacy [facility] ?

## 2021-11-30 ENCOUNTER — Telehealth: Payer: Self-pay | Admitting: Family Medicine

## 2021-11-30 NOTE — Telephone Encounter (Signed)
If he is having difficulty walking, I need to recheck him in the office.   ? ?[place], can you let him know and help set up for me. ?

## 2021-11-30 NOTE — Telephone Encounter (Signed)
Dr Lorelei Pont saw him for post viral joint pain and px this  ?I would like to route to him for his advice re: next step ?Thanks  ?

## 2021-11-30 NOTE — Telephone Encounter (Signed)
Pt notified of Dr. Lillie Fragmin comments, and appt scheduled tomorrow ? ?FYI to Dr. Lorelei Pont  ?

## 2021-11-30 NOTE — Telephone Encounter (Signed)
Pt called stating that the medication predniSONE (DELTASONE) 20 MG tablet is not working. Pt states he is down to 2 pills and is not getting any better. Pt states that he still have joint and hip pain and difficult to walk. Please advise. ?

## 2021-12-01 ENCOUNTER — Encounter: Payer: Self-pay | Admitting: Family Medicine

## 2021-12-01 ENCOUNTER — Ambulatory Visit: Payer: BC Managed Care – PPO | Admitting: Family Medicine

## 2021-12-01 ENCOUNTER — Other Ambulatory Visit: Payer: Self-pay

## 2021-12-01 VITALS — BP 150/70 | HR 96 | Temp 98.6°F | Ht 67.0 in | Wt 291.2 lb

## 2021-12-01 DIAGNOSIS — M2550 Pain in unspecified joint: Secondary | ICD-10-CM | POA: Diagnosis not present

## 2021-12-01 DIAGNOSIS — M791 Myalgia, unspecified site: Secondary | ICD-10-CM

## 2021-12-01 DIAGNOSIS — M160 Bilateral primary osteoarthritis of hip: Secondary | ICD-10-CM

## 2021-12-01 DIAGNOSIS — M25551 Pain in right hip: Secondary | ICD-10-CM

## 2021-12-01 DIAGNOSIS — M25552 Pain in left hip: Secondary | ICD-10-CM

## 2021-12-01 LAB — SEDIMENTATION RATE: Sed Rate: 11 mm/hr (ref 0–20)

## 2021-12-01 LAB — HIGH SENSITIVITY CRP: CRP, High Sensitivity: 0.31 mg/L (ref 0.000–5.000)

## 2021-12-01 NOTE — Progress Notes (Signed)
? ? ?Lezlee Gills T. Ethelwyn Gilbertson, MD, [facility] ?Therapist, music at [facility] ?[place] ?[place], [address] ? ?Phone: [phone]  FAX: [phone] ? ?Clinton Fowler - 56 y.o. male  MRN [id number]  Date of Birth: 07-03-1966 ? ?Date: 12/01/2021  PCP: Abner Greenspan, MD  Referral: Abner Greenspan, MD ? ?Chief Complaint  ?Patient presents with  ? Follow-up  ?  Bilateral hip pain (viral arthritis)  ? ? ?This visit occurred during the SARS-CoV-2 public health emergency.  Safety protocols were in place, including screening questions prior to the visit, additional usage of staff PPE, and extensive cleaning of exam room while observing appropriate contact time as indicated for disinfecting solutions.  ? ?Subjective:  ? ?Clinton Fowler is a 56 y.o. very pleasant male patient with Body mass index is 45.62 kg/m?. who presents with the following: ? ?I saw him a few weeks ago, and at that point I thought that he was having a flareup after a virus with potential viral arthritis.  He had been having some acute pain over and above what would be typical for him. ? ?Wife is here, and she notes that this is all new in the last couple of months compared to baseline. ? ?Can hardly move by the end of the day.   ?Now working on farm work all the time, on and off tractors all the time. ? ?Now can sleep to the trouble.  ?Started all around in February. ? ?Earlier in the year - pain all in the hips.  ?Now with a pulling sensation. ? ?Nothing in the legs. ?Knees ok ?Hands and wrists.  ? ?Sitting in a seated position.  ?Cannot get his socks on. ? ?30 mins to get dressed.  ? ?Systemic Rheum panel. ? ?Mom and maternal GF - just diagnosed with Rheumatoid Arthritis  ? ?Review of Systems is noted in the HPI, as appropriate  ? ?Objective:  ? ?BP (!) 150/70   Pulse 96   Temp 98.6 ?F (37 ?C) (Oral)   Ht '5\' 7"'$  (1.702 m)   Wt 291 lb 4 oz (132.1 kg)   SpO2 97%   BMI 45.62 kg/m?  ? ?GEN: No acute distress;  alert,appropriate. ?PULM: Breathing comfortably in no respiratory distress ?PSYCH: Normally interactive.   ? ? ?HIP EXAM: SIDE: B ?ROM: Abduction, Flexion, Internal and External range of motion: in IROM/EROM with hip abducted to 90, he has notable restriction of motion to roughly 15 deg total ?Pain with terminal IROM and EROM: yes ?GTB: NT ?SLR: NEG ?Knees: No effusion ?FABER: NT ?REVERSE FABER: NT, neg ?Piriformis: NT at direct palpation ?Str: flexion: 5/5 ?abduction: 5/5 ?adduction: 5/5 ?Strength testing non-tender ?  ? ?Radiology: ?DG Hip Unilat W OR W/O Pelvis 2-3 Views Left ? ?Result Date: 11/08/2021 ?CLINICAL DATA:  Osteoarthritis. Bilateral hip pain for several months. EXAM: DG HIP (WITH OR WITHOUT PELVIS) 2-3V RIGHT; DG HIP (WITH OR WITHOUT PELVIS) 2-3V LEFT COMPARISON:  None. FINDINGS: Moderate bilateral femoroacetabular joint space narrowing. Mild bilateral femoroacetabular subchondral sclerosis. Mild bilateral sacroiliac subchondral sclerosis degenerative change. The pubic symphysis joint space is maintained. No acute fracture or dislocation. IMPRESSION: Mild bilateral femoroacetabular and mild bilateral sacroiliac joint osteoarthritis. Electronically Signed   By: Yvonne Kendall M.D.   On: 11/08/2021 16:18  ? ?DG Hip Unilat W OR W/O Pelvis 2-3 Views Right ? ?Result Date: 11/08/2021 ?CLINICAL DATA:  Osteoarthritis. Bilateral hip pain for several months. EXAM: DG HIP (WITH OR WITHOUT PELVIS) 2-3V  RIGHT; DG HIP (WITH OR WITHOUT PELVIS) 2-3V LEFT COMPARISON:  None. FINDINGS: Moderate bilateral femoroacetabular joint space narrowing. Mild bilateral femoroacetabular subchondral sclerosis. Mild bilateral sacroiliac subchondral sclerosis degenerative change. The pubic symphysis joint space is maintained. No acute fracture or dislocation. IMPRESSION: Mild bilateral femoroacetabular and mild bilateral sacroiliac joint osteoarthritis. Electronically Signed   By: Yvonne Kendall M.D.   On: 11/08/2021 16:18   ? ?Assessment  and Plan:  ? ?  ICD-10-CM   ?1. Polyarthralgia  M25.50 High sensitivity CRP  ?  Sedimentation rate  ?  ANA Screen,IFA,Reflex Titer/Pattern,Reflex Mplx 11 Ab Cascade with IdentRA  ?  ?2. Primary localized osteoarthritis of both hips  M16.0   ?  ?3. Acute hip pain, bilateral  M25.551 High sensitivity CRP  ? M25.552 Sedimentation rate  ?  ANA Screen,IFA,Reflex Titer/Pattern,Reflex Mplx 11 Ab Cascade with IdentRA  ?  ?4. Myalgia  M79.10 High sensitivity CRP  ?  Sedimentation rate  ?  ANA Screen,IFA,Reflex Titer/Pattern,Reflex Mplx 11 Ab Cascade with IdentRA  ?  ? ?Total encounter time: 30 minutes. This includes total time spent on the day of encounter.  Chart review, additional review of films, discussion. ? ?I do not have a good reason for his traumatic hip pain.  Does have mild osteoarthropathy visualized on the plain film.  Clinically, he does have notable restriction of motion with pain. ? ?With a family history of rheumatoid arthritis, I think that a dedicated rheumatological work-up is quite prudent. ? ?This does not help then a guided hip injection would be an appropriate next step ? ?No orders of the defined types were placed in this encounter. ? ?There are no discontinued medications. ?Orders Placed This Encounter  ?Procedures  ? High sensitivity CRP  ? Sedimentation rate  ? ANA Screen,IFA,Reflex Titer/Pattern,Reflex Mplx 11 Ab Cascade with IdentRA  ? ? ?Follow-up: No follow-ups on file. ? ?Dragon Medical One speech-to-text software was used for transcription in this dictation.  Possible transcriptional errors can occur using Editor, commissioning.  ? ?Signed, ? ?Nayla Dias T. Ayano Douthitt, MD ? ? ?Outpatient Encounter Medications as of 12/01/2021  ?Medication Sig  ? atorvastatin (LIPITOR) 20 MG tablet TAKE 1 TABLET BY MOUTH EVERY DAY  ? lisinopril (ZESTRIL) 10 MG tablet TAKE 1 TABLET BY MOUTH EVERY DAY  ? meloxicam (MOBIC) 15 MG tablet Take 1 tablet (15 mg total) by mouth daily.  ? omeprazole (PRILOSEC) 20 MG capsule Take 1  capsule (20 mg total) by mouth daily.  ? predniSONE (DELTASONE) 20 MG tablet 2 tabs po daily for 5 days, then 1 tab po daily for 5 days  ? ?No facility-administered encounter medications on file as of 12/01/2021.  ?  ?

## 2021-12-02 ENCOUNTER — Telehealth: Payer: Self-pay | Admitting: Family Medicine

## 2021-12-02 DIAGNOSIS — M1611 Unilateral primary osteoarthritis, right hip: Secondary | ICD-10-CM

## 2021-12-02 DIAGNOSIS — M1612 Unilateral primary osteoarthritis, left hip: Secondary | ICD-10-CM

## 2021-12-02 NOTE — Telephone Encounter (Signed)
Pt called and asking for a call back to discuss his visit he had on yesterday. Please advise. ?

## 2021-12-04 MED ORDER — TRAMADOL HCL 50 MG PO TABS: 50.0000 mg | ORAL_TABLET | Freq: Three times a day (TID) | ORAL | 0 refills | Status: DC | PRN

## 2021-12-04 MED ORDER — CYCLOBENZAPRINE HCL 10 MG PO TABS: 5.0000 mg | ORAL_TABLET | Freq: Every evening | ORAL | 1 refills | Status: DC | PRN

## 2021-12-04 NOTE — Telephone Encounter (Signed)
We spoke on the phone.  Still have a lot of hip pain, now had some back pain yesterday.  Went to chiropractor and helped with back pain. ? ?I will send in tramadol and flexeril.  I will also set up for a diagnostic and therapeutic hip injection B. ? ?  ICD-10-CM   ?1. Primary localized osteoarthritis of right hip  M16.11 DG FLUORO GUIDED NEEDLE PLC ASPIRATION/INJECTION LOC  ?  ?2. Primary localized osteoarthritis of left hip  M16.12 DG FLUORO GUIDED NEEDLE PLC ASPIRATION/INJECTION LOC  ?  ? ? ?Meds ordered this encounter  ?Medications  ? traMADol (ULTRAM) 50 MG tablet  ?  Sig: Take 1 tablet (50 mg total) by mouth every 8 (eight) hours as needed for moderate pain.  ?  Dispense:  20 tablet  ?  Refill:  0  ? cyclobenzaprine (FLEXERIL) 10 MG tablet  ?  Sig: Take 0.5-1 tablets (5-10 mg total) by mouth at bedtime as needed for muscle spasms.  ?  Dispense:  30 tablet  ?  Refill:  1  ? ?There are no discontinued medications. ?Orders Placed This Encounter  ?Procedures  ? DG FLUORO GUIDED NEEDLE PLC ASPIRATION/INJECTION LOC  ? DG FLUORO GUIDED NEEDLE PLC ASPIRATION/INJECTION LOC  ? ?

## 2021-12-05 LAB — ANA SCREEN,IFA,REFLEX TITER/PATTERN,REFLEX MPLX 11 AB CASCADE
14-3-3 eta Protein: <0.2 ng/mL (ref <0.2)
Anti Nuclear Antibody (ANA): NEGATIVE
Cyclic Citrullin Peptide Ab: <16 UNITS
Rheumatoid fact SerPl-aCnc: <14 IU/mL (ref <14)

## 2021-12-07 NOTE — Telephone Encounter (Signed)
Pt is already scheduled for 12/08/2021 at [facility] ? ?Appointment Information  ?Name: Larin, Weissberg MRN: [id number]  ?Date: 12/08/2021 Status: Sch  ?Time: 11:00 AM Length: 30  ?Visit Type: DG FLUORO GUIDED [[id number]] Copay: $0.00  ?Provider: Vivi Ferns C-ARM RM 3 Department: [id number] DIAGNOSTIC  ?Referring Provider: Owens Loffler    ? ?

## 2021-12-08 ENCOUNTER — Ambulatory Visit
Admission: RE | Admit: 2021-12-08 | Discharge: 2021-12-08 | Disposition: A | Discharge status: Home / Self Care | Payer: BC Managed Care – PPO | Source: Ambulatory Visit | Attending: Family Medicine | Admitting: Family Medicine

## 2021-12-08 DIAGNOSIS — M1612 Unilateral primary osteoarthritis, left hip: Secondary | ICD-10-CM

## 2021-12-08 MED ORDER — METHYLPREDNISOLONE ACETATE 40 MG/ML INJ SUSP (RADIOLOG
80.0000 mg | Freq: Once | INTRAMUSCULAR | Status: AC
  Administered 2021-12-08: 40 mg via INTRA_ARTICULAR

## 2021-12-08 MED ORDER — IOPAMIDOL (ISOVUE-M 200) INJECTION 41%
1.0000 mL | Freq: Once | INTRAMUSCULAR | Status: AC
  Administered 2021-12-08: 1 mL via INTRA_ARTICULAR

## 2021-12-22 ENCOUNTER — Ambulatory Visit
Admission: RE | Admit: 2021-12-22 | Discharge: 2021-12-22 | Disposition: A | Discharge status: Home / Self Care | Payer: BC Managed Care – PPO | Source: Ambulatory Visit | Attending: Family Medicine | Admitting: Family Medicine

## 2021-12-22 DIAGNOSIS — M1611 Unilateral primary osteoarthritis, right hip: Secondary | ICD-10-CM

## 2021-12-22 MED ORDER — IOPAMIDOL (ISOVUE-M 200) INJECTION 41%
1.0000 mL | Freq: Once | INTRAMUSCULAR | Status: AC
  Administered 2021-12-22: 1 mL via INTRA_ARTICULAR

## 2021-12-22 MED ORDER — METHYLPREDNISOLONE ACETATE 40 MG/ML INJ SUSP (RADIOLOG
80.0000 mg | Freq: Once | INTRAMUSCULAR | Status: AC
  Administered 2021-12-22: 80 mg via INTRA_ARTICULAR

## 2021-12-23 ENCOUNTER — Other Ambulatory Visit: Payer: Self-pay | Admitting: Family Medicine

## 2022-03-11 ENCOUNTER — Telehealth: Payer: Self-pay | Admitting: Family Medicine

## 2022-03-11 NOTE — Telephone Encounter (Signed)
Aware, I am out of the office  Agree with advisement to seek care

## 2022-03-11 NOTE — Telephone Encounter (Signed)
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 He woke up with his throat feeling tight this morning. Also sent to triage nurse just in case. He wasn't with the wife so I had triage nurse give him a call

## 2022-03-11 NOTE — Telephone Encounter (Signed)
Spoke with pt and notified as instructed and pt voiced understanding. Pt said when he ate lunch his swallowing was a lot better and it did not feel like anything was catching at [employer]. Pt will continue monitor how he is swallowing and if problems over weekend pt will go to UC or ED for eval. Pt will cb next wk if any issues.again also UC and ED precautions given again and pt voiced understanding. Sending FYI to Dr Glori Bickers.

## 2022-03-11 NOTE — Telephone Encounter (Signed)
I spoke with pt pt said last night he had vomiting and diarrhea. No vomiting this morning but had diarrhea at 6:30 AM this morning.Now pt has tightness in throat and when tried to swallow his pills this morning or tried to eat breakfast pt had some problems swallowing like there is something stuck at [place]. Pt said he is not having any difficulty in breathing and offered pt an appt at [facility] but pt said he wants to wait until he eats lunch to see if the swallowing has improved and if not pt will go to UC. UC & ED precautions given and pt voiced understanding.Sending note to Dr Glori Bickers who is out of office and Dr Silvio Pate who is in office and Louretta Shorten CMA.

## 2022-03-11 NOTE — Telephone Encounter (Signed)
[facility] - Client TELEPHONE ADVICE RECORD [facility] Patient Name: Clinton Fowler Gender: Male DOB: February 06, 1966 Age: 56 Y 93 M 29 D Return Phone Number: [phone] (Primary) Address: City/ State/ Zip: [place]  [address] Client [facility] - Client Client Site [place] Provider [place], Roque Lias - MD Contact Type Call Who Is Calling Patient / Member / Family / Caregiver Call Type Triage / Clinical Relationship To Patient Self Return Phone Number [phone] (Primary) Chief Complaint Pale Skin Reason for Call Symptomatic / Request for Health Information Initial Comment Caller states he vomited last night and when he woke up this morning he felt like his throat was tightend upand looked pale. Translation No Nurse Assessment Nurse: Linward Headland, RN, [place] Date/Time (Eastern Time): 03/11/2022 8:24:47 AM Confirm and document reason for call. If symptomatic, describe symptoms. ---Caller states he vomited once last night also with diarrhea and this morning he woke up with a tight throat and his wife states he has pale skin, he is having some trouble swallowing, he had some issues swallowing his pills this morning. Does the patient have any new or worsening symptoms? ---Yes Will a triage be completed? ---Yes Related visit to physician within the last 2 weeks? ---N/A Does the PT have any chronic conditions? (i.e. diabetes, asthma, this includes High risk factors for pregnancy, etc.) ---Yes List chronic conditions. ---HTN, high cholesterol, GERD Is this a behavioral health or substance abuse call? ---No Guidelines Guideline Title Affirmed Question Affirmed Notes Nurse Date/Time Eilene Ghazi Time) Swallowing Difficulty [1] Swallowing difficulty AND [2] cause unknown (Exception: Difficulty swallowing is a chronic symptom.) Linward Headland, RN, [place] 03/11/2022 8:27:07 AM PLEASE NOTE: All timestamps  contained within this report are represented as [place] Standard Time. CONFIDENTIALTY NOTICE: This fax transmission is intended only for the addressee. It contains information that is legally privileged, confidential or otherwise protected from use or disclosure. If you are not the intended recipient, you are strictly prohibited from reviewing, disclosing, copying using or disseminating any of this information or taking any action in reliance on or regarding this information. If you have received this fax in error, please notify us immediately by telephone so that we can arrange for its return to [place]. Phone: [phone], Toll-Free: [phone], Fax: [phone] Page: 2 of 2 Call Id: [id number] [place]. Time Eilene Ghazi Time) Disposition Final User 03/11/2022 8:34:39 AM See PCP within 24 Hours Yes Linward Headland, RN, [place] Final Disposition 03/11/2022 8:34:39 AM See PCP within 24 Hours Yes Linward Headland, RN, [place] Disagree/Comply Comply Caller Understands Yes PreDisposition Call Doctor Care Advice Given Per Guideline SEE PCP WITHIN 24 HOURS: * IF OFFICE WILL BE OPEN: You need to be examined within the next 24 hours. Call your doctor (or NP/PA) when the office opens and make an appointment. CALL BACK IF: * You become worse CARE ADVICE given per Swallowed Difficulty (Adult) guideline. Referrals REFERRED TO PCP OFFIC

## 2022-10-31 ENCOUNTER — Telehealth: Payer: Self-pay | Admitting: Family Medicine

## 2022-10-31 DIAGNOSIS — Z125 Encounter for screening for malignant neoplasm of prostate: Secondary | ICD-10-CM

## 2022-10-31 DIAGNOSIS — I10 Essential (primary) hypertension: Secondary | ICD-10-CM

## 2022-10-31 DIAGNOSIS — R7303 Prediabetes: Secondary | ICD-10-CM

## 2022-10-31 DIAGNOSIS — E7800 Pure hypercholesterolemia, unspecified: Secondary | ICD-10-CM

## 2022-10-31 NOTE — Telephone Encounter (Signed)
-----   Message from Velna Hatchet, RT sent at 10/18/2022  3:58 PM EST ----- Regarding: Tue 2/27 lab Patient is scheduled for cpx, please order future labs.  Thanks, Anda Kraft

## 2022-11-01 ENCOUNTER — Other Ambulatory Visit (INDEPENDENT_AMBULATORY_CARE_PROVIDER_SITE_OTHER): Payer: BC Managed Care – PPO

## 2022-11-01 DIAGNOSIS — R7303 Prediabetes: Secondary | ICD-10-CM

## 2022-11-01 DIAGNOSIS — E7800 Pure hypercholesterolemia, unspecified: Secondary | ICD-10-CM | POA: Diagnosis not present

## 2022-11-01 DIAGNOSIS — Z125 Encounter for screening for malignant neoplasm of prostate: Secondary | ICD-10-CM

## 2022-11-01 DIAGNOSIS — I10 Essential (primary) hypertension: Secondary | ICD-10-CM

## 2022-11-01 LAB — TSH: TSH: 1.57 u[IU]/mL (ref 0.35–5.50)

## 2022-11-01 LAB — CBC WITH DIFFERENTIAL/PLATELET
Basophils Absolute: 0 10*3/uL (ref 0.0–0.1)
Basophils Relative: 0.1 % (ref 0.0–3.0)
Eosinophils Absolute: 0.2 10*3/uL (ref 0.0–0.7)
Eosinophils Relative: 3.1 % (ref 0.0–5.0)
HCT: 39.8 % (ref 39.0–52.0)
Hemoglobin: 13.1 g/dL (ref 13.0–17.0)
Lymphocytes Relative: 34.7 % (ref 12.0–46.0)
Lymphs Abs: 2.5 10*3/uL (ref 0.7–4.0)
MCHC: 33 g/dL (ref 30.0–36.0)
MCV: 80.8 fl (ref 78.0–100.0)
Monocytes Absolute: 0.7 10*3/uL (ref 0.1–1.0)
Monocytes Relative: 10.2 % (ref 3.0–12.0)
Neutro Abs: 3.8 10*3/uL (ref 1.4–7.7)
Neutrophils Relative %: 51.9 % (ref 43.0–77.0)
Platelets: 210 10*3/uL (ref 150.0–400.0)
RBC: 4.93 Mil/uL (ref 4.22–5.81)
RDW: 15.4 % (ref 11.5–15.5)
WBC: 7.3 10*3/uL (ref 4.0–10.5)

## 2022-11-01 LAB — PSA: PSA: 0.98 ng/mL (ref 0.10–4.00)

## 2022-11-01 LAB — HEMOGLOBIN A1C: Hgb A1c MFr Bld: 6.6 % — ABNORMAL HIGH (ref 4.6–6.5)

## 2022-11-02 LAB — COMPREHENSIVE METABOLIC PANEL
ALT: 21 U/L (ref 0–53)
AST: 17 U/L (ref 0–37)
Albumin: 3.9 g/dL (ref 3.5–5.2)
Alkaline Phosphatase: 56 U/L (ref 39–117)
BUN: 15 mg/dL (ref 6–23)
CO2: 29 mEq/L (ref 19–32)
Calcium: 9.2 mg/dL (ref 8.4–10.5)
Chloride: 104 mEq/L (ref 96–112)
Creatinine, Ser: 1.01 mg/dL (ref 0.40–1.50)
GFR: 84.39 mL/min (ref >60.00)
Glucose, Bld: 111 mg/dL — ABNORMAL HIGH (ref 70–99)
Potassium: 4.5 mEq/L (ref 3.5–5.1)
Sodium: 141 mEq/L (ref 135–145)
Total Bilirubin: 0.4 mg/dL (ref 0.2–1.2)
Total Protein: 6.3 g/dL (ref 6.0–8.3)

## 2022-11-02 LAB — LIPID PANEL
Cholesterol: 122 mg/dL (ref 0–200)
HDL: 39.6 mg/dL (ref >39.00)
LDL Cholesterol: 67 mg/dL (ref 0–99)
NonHDL: 82.68
Total CHOL/HDL Ratio: 3
Triglycerides: 78 mg/dL (ref 0.0–149.0)
VLDL: 15.6 mg/dL (ref 0.0–40.0)

## 2022-11-08 ENCOUNTER — Ambulatory Visit (INDEPENDENT_AMBULATORY_CARE_PROVIDER_SITE_OTHER): Payer: BC Managed Care – PPO | Admitting: Family Medicine

## 2022-11-08 ENCOUNTER — Encounter: Payer: Self-pay | Admitting: Family Medicine

## 2022-11-08 VITALS — BP 122/74 | HR 86 | Temp 98.1°F | Ht 67.0 in | Wt 288.0 lb

## 2022-11-08 DIAGNOSIS — Z1211 Encounter for screening for malignant neoplasm of colon: Secondary | ICD-10-CM | POA: Diagnosis not present

## 2022-11-08 DIAGNOSIS — Z0000 Encounter for general adult medical examination without abnormal findings: Secondary | ICD-10-CM | POA: Diagnosis not present

## 2022-11-08 DIAGNOSIS — Z79899 Other long term (current) drug therapy: Secondary | ICD-10-CM

## 2022-11-08 DIAGNOSIS — I10 Essential (primary) hypertension: Secondary | ICD-10-CM

## 2022-11-08 DIAGNOSIS — Z125 Encounter for screening for malignant neoplasm of prostate: Secondary | ICD-10-CM

## 2022-11-08 DIAGNOSIS — R7303 Prediabetes: Secondary | ICD-10-CM

## 2022-11-08 DIAGNOSIS — E7800 Pure hypercholesterolemia, unspecified: Secondary | ICD-10-CM

## 2022-11-08 MED ORDER — LISINOPRIL 10 MG PO TABS
10.0000 mg | ORAL_TABLET | Freq: Every day | ORAL | 3 refills | Status: DC
Start: 2022-11-08 — End: 2023-12-04

## 2022-11-08 MED ORDER — OMEPRAZOLE 20 MG PO CPDR: DELAYED_RELEASE_CAPSULE | ORAL | 3 refills | Status: DC

## 2022-11-08 MED ORDER — ATORVASTATIN CALCIUM 20 MG PO TABS: 20.0000 mg | ORAL_TABLET | Freq: Every day | ORAL | 3 refills | Status: DC

## 2022-11-08 NOTE — Assessment & Plan Note (Signed)
Lab Results  Component Value Date   PSA 0.98 11/01/2022   PSA 0.54 12/10/2020   PSA 0.44 12/17/2019    No urinary changes  0-1 nocturia

## 2022-11-08 NOTE — Assessment & Plan Note (Signed)
Pt declines any colon cancer screening (incl colonoscopy or cologuard) at this time

## 2022-11-08 NOTE — Assessment & Plan Note (Signed)
Discussed how this problem influences overall health and the risks it imposes  Reviewed plan for weight loss with lower calorie diet (via better food choices and also portion control or program like weight watchers) and exercise building up to or more than 30 minutes 5 days per week including some aerobic activity   Now A1c is up  Disc low glycemic diet Has a job with very heavy work/ farming for exercise

## 2022-11-08 NOTE — Assessment & Plan Note (Signed)
A1c is up in DM range Lab Results  Component Value Date   HGBA1C 6.6 (H) 11/01/2022   Father had dm he thinks  disc imp of low glycemic diet and wt loss to prevent DM2  F/u 3 mo  Handouts given He is motivated to stop all sugar drinks incl tea and juice and soda

## 2022-11-08 NOTE — Assessment & Plan Note (Signed)
Will get B12 with next labs

## 2022-11-08 NOTE — Patient Instructions (Addendum)
Here is info on the shingrix vaccine    For diabetes Try to get most of your carbohydrates from produce (with the exception of white potatoes)  Eat less bread/pasta/rice/snack foods/cereals/sweets and other items from the middle of the grocery store (processed carbs)  Get rid of sweet drinks completely  (just drink water or occasional diet drink)  Keep working on protion size   Follow up in 3 months - we will re check A1c that day  Really work on weight loss

## 2022-11-08 NOTE — Assessment & Plan Note (Signed)
bp in fair control at this time  BP Readings from Last 1 Encounters:  11/08/22 122/74   No changes needed Most recent labs reviewed  Disc lifstyle change with low sodium diet and exercise  Plan to continue lisinopril 10 mg daily

## 2022-11-08 NOTE — Progress Notes (Signed)
Subjective:    Patient ID: Clinton Fowler, male    DOB: [birth year], 57 y.o.   MRN: [id number]  HPI Here for health maintenance exam and to review chronic medical problems    Wt Readings from Last 3 Encounters:  11/08/22 288 lb (130.6 kg)  12/01/21 291 lb 4 oz (132.1 kg)  11/08/21 292 lb 1 oz (132.5 kg)   45.11 kg/m  Has been eating less  Cut back on portion size and lost a few lb   Vitals:   11/08/22 0801  BP: 122/74  Pulse: 86  Temp: 98.1 F (36.7 C)  SpO2: 96%    Immunization History  Administered Date(s) Administered   Influenza Split 09/07/2012   Influenza,inj,Quad PF,6+ Mos 09/13/2013, 10/28/2014, 08/19/2016, 10/03/2017, 10/24/2018   Td 09/05/2005, 12/17/2019   Health Maintenance Due  Topic Date Due   COVID-19 Vaccine (1) Never done   Hepatitis C Screening  Never done   COLONOSCOPY (Pts 45-62yr Insurance coverage will need to be confirmed)  Never done   Zoster Vaccines- Shingrix (1 of 2) Never done   INFLUENZA VACCINE  04/05/2022    Colon cancer screening - never got it  Decided against any screening at all   Prostate health Lab Results  Component Value Date   PSA 0.98 11/01/2022   PSA 0.54 12/10/2020   PSA 0.44 12/17/2019  Father had prostate cancer at a late age No urinary changes Nocturia - once or none    Flu shot- declines  Shingrix - may be interested/ wants to learn more   HTN bp is stable today  No cp or palpitations or headaches or edema  No side effects to medicines  BP Readings from Last 3 Encounters:  11/08/22 122/74  12/01/21 (!) 150/70  11/08/21 130/80     Lisinopril 10 mg daily   Lab Results  Component Value Date   CREATININE 1.01 11/01/2022   BUN 15 11/01/2022   NA 141 11/01/2022   K 4.5 11/01/2022   CL 104 11/01/2022   CO2 29 11/01/2022   GFR 84.3   GERD Omeprazole 20 - takes daily and this works well for him     Hyperlipidemia Lab Results  Component Value Date   CHOL 122 11/01/2022   CHOL 125  12/10/2020   CHOL 130 12/17/2019   Lab Results  Component Value Date   HDL 39.60 11/01/2022   HDL 36.60 (L) 12/10/2020   HDL 35.20 (L) 12/17/2019   Lab Results  Component Value Date   LDLCALC 67 11/01/2022   LDLCALC 76 12/10/2020   LDLCALC 78 12/17/2019   Lab Results  Component Value Date   TRIG 78.0 11/01/2022   TRIG 63.0 12/10/2020   TRIG 80.0 12/17/2019   Lab Results  Component Value Date   CHOLHDL 3 11/01/2022   CHOLHDL 3 12/10/2020   CHOLHDL 4 12/17/2019   Lab Results  Component Value Date   LDLDIRECT 160.7 09/07/2012   LDLDIRECT 157.4 03/06/2012   LDLDIRECT 161.6 10/09/2009    Atorvastatin 20 mg daily  Well controlled    Prediabetes Lab Results  Component Value Date   HGBA1C 6.6 (H) 11/01/2022   Up from 6.3   Father has diabetes   Working on diet  Drink soft drinks and sweet tea  No alcohol    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    Other labs Lab Results  Component Value Date   WBC 7.3 11/01/2022   HGB 13.1  11/01/2022   HCT 39.8 11/01/2022   MCV 80.8 11/01/2022   PLT 210.0 11/01/2022   Lab Results  Component Value Date   TSH 1.57 11/01/2022    Patient Active Problem List   Diagnosis Date Noted   Current use of proton pump inhibitor 11/08/2022   Colon cancer screening 12/17/2019   Hydrocele in adult 08/28/2015   Routine general medical examination at a health care facility 09/07/2012   Prediabetes 09/07/2012   Prostate cancer screening 09/07/2012   Morbid obesity ([place]) 03/06/2012   Essential hypertension, benign 10/09/2009   Hyperlipidemia 10/02/2008   Past Medical History:  Diagnosis Date   Abnormal transaminases    elevated transaminases (likely fatty liver)   Biceps tendon rupture    history of biceps tendon rupture bilaterally   History of gastroesophageal reflux (GERD)    History of hyperlipidemia    History of hypertension    Obesity    Past Surgical History:  Procedure Laterality Date    APPENDECTOMY     BICEPS TENDON REPAIR     ruptured biceps   Social History   Tobacco Use   Smoking status: Never    Passive exposure: Never   Smokeless tobacco: Never  Vaping Use   Vaping Use: Never used  Substance Use Topics   Alcohol use: Not Currently   Drug use: No   Family History  Problem Relation Age of Onset   Hypertension Father    Prostate cancer Father    Cancer - Other Mother        mouth cancer   Coronary artery disease Other        Grandmother   Coronary artery disease Other        Grandfather   No Known Allergies No current outpatient medications on file prior to visit.   No current facility-administered medications on file prior to visit.    Review of Systems  Constitutional:  Negative for activity change, appetite change, fatigue, fever and unexpected weight change.  HENT:  Negative for congestion, rhinorrhea, sore throat and trouble swallowing.   Eyes:  Negative for pain, redness, itching and visual disturbance.  Respiratory:  Negative for cough, chest tightness, shortness of breath and wheezing.   Cardiovascular:  Negative for chest pain and palpitations.  Gastrointestinal:  Negative for abdominal pain, blood in stool, constipation, diarrhea and nausea.  Endocrine: Negative for cold intolerance, heat intolerance, polydipsia and polyuria.  Genitourinary:  Negative for difficulty urinating, dysuria, frequency and urgency.  Musculoskeletal:  Positive for arthralgias. Negative for joint swelling and myalgias.  Skin:  Negative for pallor and rash.  Neurological:  Negative for dizziness, tremors, weakness, numbness and headaches.  Hematological:  Negative for adenopathy. Does not bruise/bleed easily.  Psychiatric/Behavioral:  Negative for decreased concentration and dysphoric mood. The patient is not nervous/anxious.        Objective:   Physical Exam Constitutional:      General: He is not in acute distress.    Appearance: Normal appearance. He is  well-developed. He is obese. He is not ill-appearing or diaphoretic.  HENT:     Head: Normocephalic and atraumatic.     Right Ear: Tympanic membrane, ear canal and external ear normal.     Left Ear: Tympanic membrane, ear canal and external ear normal.     Nose: Nose normal. No congestion.     Mouth/Throat:     Mouth: Mucous membranes are moist.     Pharynx: Oropharynx is clear. No posterior oropharyngeal erythema.  Eyes:  General: No scleral icterus.       Right eye: No discharge.        Left eye: No discharge.     Conjunctiva/sclera: Conjunctivae normal.     Pupils: Pupils are equal, round, and reactive to light.  Neck:     Thyroid: No thyromegaly.     Vascular: No carotid bruit or JVD.  Cardiovascular:     Rate and Rhythm: Normal rate and regular rhythm.     Pulses: Normal pulses.     Heart sounds: Normal heart sounds.     No gallop.  Pulmonary:     Effort: Pulmonary effort is normal. No respiratory distress.     Breath sounds: Normal breath sounds. No stridor. No wheezing, rhonchi or rales.     Comments: Good air exch Abdominal:     General: Abdomen is protuberant. Bowel sounds are normal. There is no distension or abdominal bruit.     Palpations: Abdomen is soft. There is no mass.     Tenderness: There is no abdominal tenderness.     Hernia: No hernia is present.  Musculoskeletal:        General: No tenderness.     Cervical back: Normal range of motion and neck supple. No rigidity. No muscular tenderness.     Right lower leg: No edema.     Left lower leg: No edema.  Lymphadenopathy:     Cervical: No cervical adenopathy.  Skin:    General: Skin is warm and dry.     Coloration: Skin is not pale.     Findings: No erythema or rash.     Comments: Ruddy complexion Solar lentigines diffusely  Face/neck/ hands and forearms with solar aging and damage  Neurological:     Mental Status: He is alert.     Cranial Nerves: No cranial nerve deficit.     Motor: No abnormal  muscle tone.     Coordination: Coordination normal.     Gait: Gait normal.     Deep Tendon Reflexes: Reflexes are normal and symmetric. Reflexes normal.  Psychiatric:        Attention and Perception: Attention normal.        Mood and Affect: Mood normal.        Cognition and Memory: Cognition and memory normal.     Comments: Pleasant            Assessment & Plan:   Problem List Items Addressed This Visit       Cardiovascular and Mediastinum   Essential hypertension, benign    bp in fair control at this time  BP Readings from Last 1 Encounters:  11/08/22 122/74  No changes needed Most recent labs reviewed  Disc lifstyle change with low sodium diet and exercise  Plan to continue lisinopril 10 mg daily      Relevant Medications   atorvastatin (LIPITOR) 20 MG tablet   lisinopril (ZESTRIL) 10 MG tablet     Other   Colon cancer screening    Pt declines any colon cancer screening (incl colonoscopy or cologuard) at this time      Current use of proton pump inhibitor    Will get B12 with next labs      Hyperlipidemia    Disc goals for lipids and reasons to control them Rev last labs with pt Rev low sat fat diet in detail Well controlled with atorvastatin 20 mg daily        Relevant Medications   atorvastatin (LIPITOR)  20 MG tablet   lisinopril (ZESTRIL) 10 MG tablet   Morbid obesity ([facility])    Discussed how this problem influences overall health and the risks it imposes  Reviewed plan for weight loss with lower calorie diet (via better food choices and also portion control or program like weight watchers) and exercise building up to or more than 30 minutes 5 days per week including some aerobic activity   Now A1c is up  Disc low glycemic diet Has a job with very heavy work/ farming for exercise      Prediabetes    A1c is up in DM range Lab Results  Component Value Date   HGBA1C 6.6 (H) 11/01/2022  Father had dm he thinks  disc imp of low glycemic diet and  wt loss to prevent DM2  F/u 3 mo  Handouts given He is motivated to stop all sugar drinks incl tea and juice and soda      Prostate cancer screening    Lab Results  Component Value Date   PSA 0.98 11/01/2022   PSA 0.54 12/10/2020   PSA 0.44 12/17/2019   No urinary changes  0-1 nocturia       Routine general medical examination at a health care facility - Primary    Reviewed health habits including diet and exercise and skin cancer prevention Reviewed appropriate screening tests for age  Also reviewed health mt list, fam hx and immunization status , as well as social and family history   See HPI Labs reviewed  Declines any colon cancer screening  Declines flu shot  Given info on shingrix-may consider Psa is in nl range Disc plan for wt loss in setting of inc a1c

## 2022-11-08 NOTE — Assessment & Plan Note (Signed)
Disc goals for lipids and reasons to control them Rev last labs with pt Rev low sat fat diet in detail Well controlled with atorvastatin 20 mg daily

## 2022-11-08 NOTE — Assessment & Plan Note (Addendum)
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

## 2022-11-10 ENCOUNTER — Telehealth: Payer: Self-pay | Admitting: Family Medicine

## 2022-11-10 NOTE — Telephone Encounter (Signed)
Pt said PCP has done a letter multiple time in the past it's the same letter. Pt just needs it to say he is okay to get fit testing done and able to wear a respirator at work. 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  Last 2 letters said:  "The above patient is physically fit to wear a respirator for work"  "You are healthy enough to undergo FIT testing without restrictions."   Pt does need paper letter to hand to the FIT testing dpt so doesn't need it released in [place]

## 2022-11-10 NOTE — Telephone Encounter (Signed)
Please clarify?  Does that mean is he safe to wear a respirator? Like at work? What type?   Let me know , should not be a problem

## 2022-11-10 NOTE — Telephone Encounter (Signed)
Patient called in and stated that he needs a letter stating that he is fit to wear a respiratory. He will like a call when this is ready for pickup. Thank you!

## 2022-11-10 NOTE — Telephone Encounter (Signed)
Done and in IN box 

## 2022-11-10 NOTE — Telephone Encounter (Signed)
Pt notified letter ready for pick up. 

## 2023-02-07 ENCOUNTER — Ambulatory Visit: Payer: BC Managed Care – PPO | Admitting: Family Medicine

## 2023-02-07 ENCOUNTER — Encounter: Payer: Self-pay | Admitting: Family Medicine

## 2023-02-07 VITALS — BP 126/80 | HR 88 | Temp 98.0°F | Ht 67.0 in | Wt 280.0 lb

## 2023-02-07 DIAGNOSIS — I10 Essential (primary) hypertension: Secondary | ICD-10-CM

## 2023-02-07 DIAGNOSIS — R7303 Prediabetes: Secondary | ICD-10-CM

## 2023-02-07 LAB — POCT GLYCOSYLATED HEMOGLOBIN (HGB A1C): Hemoglobin A1C: 6.2 % — AB (ref 4.0–5.6)

## 2023-02-07 NOTE — Patient Instructions (Signed)
Your A1c is down to 6.2 - great! In the prediabetes range   Keep up the good work with better diet and weight loss  Continue to avoid sugar drinks when you can Stay active   Try to get most of your carbohydrates from produce (with the exception of white potatoes)  Eat less bread/pasta/rice/snack foods/cereals/sweets and other items from the middle of the grocery store (processed carbs)

## 2023-02-07 NOTE — Progress Notes (Signed)
Subjective:    Patient ID: Clinton Fowler, male    DOB: Mar 12, 1966, 57 y.o.   MRN: [id number]  HPI Pt presents for f/u of prediabetes   Wt Readings from Last 3 Encounters:  02/07/23 280 lb (127 kg)  11/08/22 288 lb (130.6 kg)  12/01/21 291 lb 4 oz (132.1 kg)   43.85 kg/m  Vitals:   02/07/23 0806  BP: 126/80  Pulse: 88  Temp: 98 F (36.7 C)  SpO2: 92%   Last A1c was 6.6  Today 6.2 -improved    Drinking water instead of sugar drinks  Cut out the sweet tea  Eating smaller portions   Less sweets  Limits his bread   Plenty of exercise farming    HTN bp is stable today  No cp or palpitations or headaches or edema  No side effects to medicines  BP Readings from Last 3 Encounters:  02/07/23 126/80  11/08/22 122/74  12/01/21 (!) 150/70    Lisinopril 10 mg daily     Pulse Readings from Last 3 Encounters:  02/07/23 88  11/08/22 86  12/01/21 96    Patient Active Problem List   Diagnosis Date Noted   Current use of proton pump inhibitor 11/08/2022   Colon cancer screening 12/17/2019   Hydrocele in adult 08/28/2015   Routine general medical examination at a health care facility 09/07/2012   Prediabetes 09/07/2012   Prostate cancer screening 09/07/2012   Morbid obesity ([facility]) 03/06/2012   Essential hypertension, benign 10/09/2009   Hyperlipidemia 10/02/2008   Past Medical History:  Diagnosis Date   Abnormal transaminases    elevated transaminases (likely fatty liver)   Biceps tendon rupture    history of biceps tendon rupture bilaterally   History of gastroesophageal reflux (GERD)    History of hyperlipidemia    History of hypertension    Obesity    Past Surgical History:  Procedure Laterality Date   APPENDECTOMY     BICEPS TENDON REPAIR     ruptured biceps   Social History   Tobacco Use   Smoking status: Never    Passive exposure: Never   Smokeless tobacco: Never  Vaping Use   Vaping Use: Never used  Substance Use Topics   Alcohol use:  Not Currently   Drug use: No   Family History  Problem Relation Age of Onset   Hypertension Father    Prostate cancer Father    Cancer - Other Mother        mouth cancer   Coronary artery disease Other        Grandmother   Coronary artery disease Other        Grandfather   No Known Allergies Current Outpatient Medications on File Prior to Visit  Medication Sig Dispense Refill   atorvastatin (LIPITOR) 20 MG tablet Take 1 tablet (20 mg total) by mouth daily. 90 tablet 3   lisinopril (ZESTRIL) 10 MG tablet Take 1 tablet (10 mg total) by mouth daily. 90 tablet 3   omeprazole (PRILOSEC) 20 MG capsule TAKE 1 CAPSULE BY MOUTH EVERY DAY 90 capsule 3   No current facility-administered medications on file prior to visit.      Review of Systems  Constitutional:  Negative for activity change, appetite change, fatigue, fever and unexpected weight change.  HENT:  Negative for congestion, rhinorrhea, sore throat and trouble swallowing.   Eyes:  Negative for pain, redness, itching and visual disturbance.  Respiratory:  Negative for cough, chest tightness, shortness of  breath and wheezing.   Cardiovascular:  Negative for chest pain and palpitations.  Gastrointestinal:  Negative for abdominal pain, blood in stool, constipation, diarrhea and nausea.  Endocrine: Negative for cold intolerance, heat intolerance, polydipsia and polyuria.  Genitourinary:  Negative for difficulty urinating, dysuria, frequency and urgency.  Musculoskeletal:  Negative for arthralgias, joint swelling and myalgias.  Skin:  Negative for pallor and rash.  Neurological:  Negative for dizziness, tremors, weakness, numbness and headaches.  Hematological:  Negative for adenopathy. Does not bruise/bleed easily.  Psychiatric/Behavioral:  Negative for decreased concentration and dysphoric mood. The patient is not nervous/anxious.        Objective:   Physical Exam Constitutional:      General: He is not in acute distress.     Appearance: Normal appearance. He is well-developed. He is obese. He is not ill-appearing or diaphoretic.  HENT:     Head: Normocephalic and atraumatic.  Eyes:     Conjunctiva/sclera: Conjunctivae normal.     Pupils: Pupils are equal, round, and reactive to light.  Neck:     Thyroid: No thyromegaly.     Vascular: No carotid bruit or JVD.  Cardiovascular:     Rate and Rhythm: Normal rate and regular rhythm.     Heart sounds: Normal heart sounds.     No gallop.  Pulmonary:     Effort: Pulmonary effort is normal. No respiratory distress.     Breath sounds: Normal breath sounds. No wheezing or rales.  Abdominal:     General: There is no distension or abdominal bruit.     Palpations: Abdomen is soft.  Musculoskeletal:     Cervical back: Normal range of motion and neck supple.     Right lower leg: No edema.     Left lower leg: No edema.  Lymphadenopathy:     Cervical: No cervical adenopathy.  Skin:    General: Skin is warm and dry.     Coloration: Skin is not pale.     Findings: No rash.  Neurological:     Mental Status: He is alert.     Coordination: Coordination normal.     Deep Tendon Reflexes: Reflexes are normal and symmetric. Reflexes normal.  Psychiatric:        Mood and Affect: Mood normal.           Assessment & Plan:   Problem List Items Addressed This Visit       Cardiovascular and Mediastinum   Essential hypertension, benign    bp in fair control at this time  BP Readings from Last 1 Encounters:  02/07/23 126/80  No changes needed Most recent labs reviewed  Disc lifstyle change with low sodium diet and exercise  Plan to continue lisinopril 10 mg daily        Other   Prediabetes - Primary    Lab Results  Component Value Date   HGBA1C 6.2 (A) 02/07/2023  This is down from 6.6  9 lb wt loss so far with lower glycemic diet and exercise (busy farming season for exercise)  No longer drinks sweet tea - commended  Pleased with progress so far   Encouraged him to continue working on this       Relevant Orders   HgB A1c (Completed)

## 2023-02-07 NOTE — Assessment & Plan Note (Signed)
Lab Results  Component Value Date   HGBA1C 6.2 (A) 02/07/2023   This is down from 6.6  9 lb wt loss so far with lower glycemic diet and exercise (busy farming season for exercise)  No longer drinks sweet tea - commended  Pleased with progress so far  Encouraged him to continue working on this

## 2023-02-07 NOTE — Assessment & Plan Note (Signed)
bp in fair control at this time  BP Readings from Last 1 Encounters:  02/07/23 126/80   No changes needed Most recent labs reviewed  Disc lifstyle change with low sodium diet and exercise  Plan to continue lisinopril 10 mg daily

## 2023-02-08 ENCOUNTER — Ambulatory Visit: Payer: BC Managed Care – PPO | Admitting: Family Medicine

## 2023-11-26 ENCOUNTER — Telehealth: Payer: Self-pay | Admitting: Family Medicine

## 2023-11-26 DIAGNOSIS — Z125 Encounter for screening for malignant neoplasm of prostate: Secondary | ICD-10-CM

## 2023-11-26 DIAGNOSIS — R7303 Prediabetes: Secondary | ICD-10-CM

## 2023-11-26 DIAGNOSIS — E7800 Pure hypercholesterolemia, unspecified: Secondary | ICD-10-CM

## 2023-11-26 DIAGNOSIS — Z79899 Other long term (current) drug therapy: Secondary | ICD-10-CM

## 2023-11-26 DIAGNOSIS — I10 Essential (primary) hypertension: Secondary | ICD-10-CM

## 2023-11-26 NOTE — Telephone Encounter (Signed)
-----   Message from Alvina Chou sent at 11/23/2023  3:07 PM EDT ----- Regarding: Lab orders for Mon, 3.24.25 Patient is scheduled for CPX labs, please order future labs, Thanks , Camelia Eng

## 2023-11-27 ENCOUNTER — Other Ambulatory Visit (INDEPENDENT_AMBULATORY_CARE_PROVIDER_SITE_OTHER): Payer: Self-pay

## 2023-11-27 DIAGNOSIS — R7303 Prediabetes: Secondary | ICD-10-CM

## 2023-11-27 DIAGNOSIS — E7800 Pure hypercholesterolemia, unspecified: Secondary | ICD-10-CM | POA: Diagnosis not present

## 2023-11-27 DIAGNOSIS — Z79899 Other long term (current) drug therapy: Secondary | ICD-10-CM | POA: Diagnosis not present

## 2023-11-27 DIAGNOSIS — Z125 Encounter for screening for malignant neoplasm of prostate: Secondary | ICD-10-CM | POA: Diagnosis not present

## 2023-11-27 DIAGNOSIS — I10 Essential (primary) hypertension: Secondary | ICD-10-CM | POA: Diagnosis not present

## 2023-11-27 LAB — COMPREHENSIVE METABOLIC PANEL
ALT: 20 U/L (ref 0–53)
AST: 16 U/L (ref 0–37)
Albumin: 4.1 g/dL (ref 3.5–5.2)
Alkaline Phosphatase: 47 U/L (ref 39–117)
BUN: 14 mg/dL (ref 6–23)
CO2: 29 meq/L (ref 19–32)
Calcium: 8.8 mg/dL (ref 8.4–10.5)
Chloride: 104 meq/L (ref 96–112)
Creatinine, Ser: 0.93 mg/dL (ref 0.40–1.50)
GFR: 92.47 mL/min (ref >60.00)
Glucose, Bld: 112 mg/dL — ABNORMAL HIGH (ref 70–99)
Potassium: 4.2 meq/L (ref 3.5–5.1)
Sodium: 139 meq/L (ref 135–145)
Total Bilirubin: 0.4 mg/dL (ref 0.2–1.2)
Total Protein: 6.4 g/dL (ref 6.0–8.3)

## 2023-11-27 LAB — CBC WITH DIFFERENTIAL/PLATELET
Basophils Absolute: 0 10*3/uL (ref 0.0–0.1)
Basophils Relative: 0.1 % (ref 0.0–3.0)
Eosinophils Absolute: 0.2 10*3/uL (ref 0.0–0.7)
Eosinophils Relative: 2.9 % (ref 0.0–5.0)
HCT: 42 % (ref 39.0–52.0)
Hemoglobin: 13.9 g/dL (ref 13.0–17.0)
Lymphocytes Relative: 38.8 % (ref 12.0–46.0)
Lymphs Abs: 2.6 10*3/uL (ref 0.7–4.0)
MCHC: 33.2 g/dL (ref 30.0–36.0)
MCV: 84.8 fl (ref 78.0–100.0)
Monocytes Absolute: 0.6 10*3/uL (ref 0.1–1.0)
Monocytes Relative: 9.7 % (ref 3.0–12.0)
Neutro Abs: 3.3 10*3/uL (ref 1.4–7.7)
Neutrophils Relative %: 48.5 % (ref 43.0–77.0)
Platelets: 195 10*3/uL (ref 150.0–400.0)
RBC: 4.95 Mil/uL (ref 4.22–5.81)
RDW: 15.3 % (ref 11.5–15.5)
WBC: 6.7 10*3/uL (ref 4.0–10.5)

## 2023-11-27 LAB — LIPID PANEL
Cholesterol: 116 mg/dL (ref 0–200)
HDL: 34.9 mg/dL — ABNORMAL LOW (ref >39.00)
LDL Cholesterol: 70 mg/dL (ref 0–99)
NonHDL: 80.77
Total CHOL/HDL Ratio: 3
Triglycerides: 53 mg/dL (ref 0.0–149.0)
VLDL: 10.6 mg/dL (ref 0.0–40.0)

## 2023-11-27 LAB — VITAMIN D 25 HYDROXY (VIT D DEFICIENCY, FRACTURES): VITD: 22.81 ng/mL — ABNORMAL LOW (ref 30.00–100.00)

## 2023-11-27 LAB — TSH: TSH: 1.22 u[IU]/mL (ref 0.35–5.50)

## 2023-11-27 LAB — PSA: PSA: 0.71 ng/mL (ref 0.10–4.00)

## 2023-11-27 LAB — VITAMIN B12: Vitamin B-12: 190 pg/mL — ABNORMAL LOW (ref 211–911)

## 2023-11-27 LAB — HEMOGLOBIN A1C: Hgb A1c MFr Bld: 6.3 % (ref 4.6–6.5)

## 2023-12-04 ENCOUNTER — Encounter: Payer: Self-pay | Admitting: Family Medicine

## 2023-12-04 ENCOUNTER — Ambulatory Visit (INDEPENDENT_AMBULATORY_CARE_PROVIDER_SITE_OTHER): Payer: Self-pay | Admitting: Family Medicine

## 2023-12-04 VITALS — BP 122/80 | HR 74 | Temp 98.0°F | Ht 66.75 in | Wt 284.4 lb

## 2023-12-04 DIAGNOSIS — Z0000 Encounter for general adult medical examination without abnormal findings: Secondary | ICD-10-CM

## 2023-12-04 DIAGNOSIS — E7800 Pure hypercholesterolemia, unspecified: Secondary | ICD-10-CM

## 2023-12-04 DIAGNOSIS — Z1211 Encounter for screening for malignant neoplasm of colon: Secondary | ICD-10-CM

## 2023-12-04 DIAGNOSIS — Z79899 Other long term (current) drug therapy: Secondary | ICD-10-CM | POA: Diagnosis not present

## 2023-12-04 DIAGNOSIS — E559 Vitamin D deficiency, unspecified: Secondary | ICD-10-CM

## 2023-12-04 DIAGNOSIS — E538 Deficiency of other specified B group vitamins: Secondary | ICD-10-CM | POA: Diagnosis not present

## 2023-12-04 DIAGNOSIS — R7303 Prediabetes: Secondary | ICD-10-CM

## 2023-12-04 DIAGNOSIS — I10 Essential (primary) hypertension: Secondary | ICD-10-CM

## 2023-12-04 DIAGNOSIS — Z125 Encounter for screening for malignant neoplasm of prostate: Secondary | ICD-10-CM

## 2023-12-04 DIAGNOSIS — K219 Gastro-esophageal reflux disease without esophagitis: Secondary | ICD-10-CM | POA: Insufficient documentation

## 2023-12-04 MED ORDER — OMEPRAZOLE 20 MG PO CPDR: DELAYED_RELEASE_CAPSULE | ORAL | 3 refills | Status: AC

## 2023-12-04 MED ORDER — ATORVASTATIN CALCIUM 20 MG PO TABS: 20.0000 mg | ORAL_TABLET | Freq: Every day | ORAL | 3 refills | Status: AC

## 2023-12-04 MED ORDER — CYANOCOBALAMIN 1000 MCG/ML IJ SOLN
1000.0000 ug | Freq: Once | INTRAMUSCULAR | Status: AC
  Administered 2023-12-04: 1000 ug via INTRAMUSCULAR

## 2023-12-04 MED ORDER — LISINOPRIL 10 MG PO TABS: 10.0000 mg | ORAL_TABLET | Freq: Every day | ORAL | 3 refills | Status: AC

## 2023-12-04 NOTE — Progress Notes (Signed)
 Subjective:    Patient ID: Clinton Fowler, male    DOB: 1965-11-23, 58 y.o.   MRN: [id number]  HPI  Here for health maintenance exam and to review chronic medical problems   Wt Readings from Last 3 Encounters:  12/04/23 284 lb 6 oz (129 kg)  02/07/23 280 lb (127 kg)  11/08/22 288 lb (130.6 kg)   44.87 kg/m  Vitals:   12/04/23 0822 12/04/23 0853  BP: (!) 150/88 122/80  Pulse: 74   Temp: 98 F (36.7 C)   SpO2: 96%     Immunization History  Administered Date(s) Administered   Influenza Split 09/07/2012   Influenza,inj,Quad PF,6+ Mos 09/13/2013, 10/28/2014, 08/19/2016, 10/03/2017, 10/24/2018   Td 09/05/2005, 12/17/2019    Health Maintenance Due  Topic Date Due   Colonoscopy  Never done    Declines vaccines   Hearing screen passed today  Hearing Screening   500Hz  1000Hz  2000Hz  4000Hz   Right ear 40 40 40 40  Left ear 40 40 40 40     Has to undergo fit testing for job   No urinary changes  No nocturia  Father had prostate cancer   Prostate health Lab Results  Component Value Date   PSA 0.71 11/27/2023   PSA 0.98 11/01/2022   PSA 0.54 12/10/2020    Colon cancer screening  Thinks he is ready to do it     Bone health   Falls-none  Fractures-none  Supplements  Last vitamin D Lab Results  Component Value Date   VD25OH 22.81 (L) 11/27/2023    Exercise    Mood    12/04/2023    8:31 AM 11/08/2022    8:04 AM 12/17/2020   11:21 AM 12/17/2019    9:14 AM 10/24/2018    8:52 AM  Depression screen PHQ 2/9  Decreased Interest 0 0 0 0 0  Down, Depressed, Hopeless 0 0 0 0 0  PHQ - 2 Score 0 0 0 0 0  Altered sleeping 0  0 0   Tired, decreased energy 0  0 0   Change in appetite 0  0 0   Feeling bad or failure about yourself  0  0 0   Trouble concentrating 0  0 0   Moving slowly or fidgety/restless 0  0 0   Suicidal thoughts 0  0 0   PHQ-9 Score 0  0 0   Difficult doing work/chores Not difficult at all  Not difficult at all Not difficult at all      HTN  No cp or palpitations or headaches or edema  No side effects to medicines  BP Readings from Last 3 Encounters:  12/04/23 122/80  02/07/23 126/80  11/08/22 122/74    Lisinopril 10 mg daily  Forgot medicine today    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    PPI use  GERD - omeprazole 20 mg daily  Lab Results  Component Value Date   VITAMINB12 190 (L) 11/27/2023   Low B12 is new   Hyperlipidemia Lab Results  Component Value Date   CHOL 116 11/27/2023   CHOL 122 11/01/2022   CHOL 125 12/10/2020   Lab Results  Component Value Date   HDL 34.90 (L) 11/27/2023   HDL 39.60 11/01/2022   HDL 36.60 (L) 12/10/2020   Lab Results  Component Value Date   LDLCALC 70 11/27/2023   LDLCALC 67 11/01/2022   LDLCALC 76 12/10/2020   Lab Results  Component Value Date  TRIG 53.0 11/27/2023   TRIG 78.0 11/01/2022   TRIG 63.0 12/10/2020   Lab Results  Component Value Date   CHOLHDL 3 11/27/2023   CHOLHDL 3 11/01/2022   CHOLHDL 3 12/10/2020   Lab Results  Component Value Date   LDLDIRECT 160.7 09/07/2012   LDLDIRECT 157.4 03/06/2012   LDLDIRECT 161.6 10/09/2009   Atorvastatin 20 mg daily   Tries not to eat a lot of fried food but does some    Prediabetes  Lab Results  Component Value Date   HGBA1C 6.3 11/27/2023   HGBA1C 6.2 (A) 02/07/2023   HGBA1C 6.6 (H) 11/01/2022    Eats out fairly often      Patient Active Problem List   Diagnosis Date Noted   Vitamin B12 deficiency 12/04/2023   Vitamin D deficiency 12/04/2023   GERD (gastroesophageal reflux disease) 12/04/2023   Current use of proton pump inhibitor 11/08/2022   Colon cancer screening 12/17/2019   Hydrocele in adult 08/28/2015   Routine general medical examination at a health care facility 09/07/2012   Prediabetes 09/07/2012   Prostate cancer screening 09/07/2012   Morbid obesity (HCC) 03/06/2012   Essential hypertension, benign 10/09/2009   Hyperlipidemia 10/02/2008   Past  Medical History:  Diagnosis Date   Abnormal transaminases    elevated transaminases (likely fatty liver)   Biceps tendon rupture    history of biceps tendon rupture bilaterally   History of gastroesophageal reflux (GERD)    History of hyperlipidemia    History of hypertension    Obesity    Past Surgical History:  Procedure Laterality Date   APPENDECTOMY     BICEPS TENDON REPAIR     ruptured biceps   Social History   Tobacco Use   Smoking status: Never    Passive exposure: Never   Smokeless tobacco: Never  Vaping Use   Vaping status: Never Used  Substance Use Topics   Alcohol use: Not Currently   Drug use: No   Family History  Problem Relation Age of Onset   Hypertension Father    Prostate cancer Father    Cancer - Other Mother        mouth cancer   Coronary artery disease Other        Grandmother   Coronary artery disease Other        Grandfather   No Known Allergies Current Outpatient Medications on File Prior to Visit  Medication Sig Dispense Refill   cholecalciferol (VITAMIN D3) 25 MCG (1000 UNIT) tablet Take 2,000 Units by mouth daily.     cyanocobalamin (VITAMIN B12) 1000 MCG tablet Take 1,000 mcg by mouth daily.     No current facility-administered medications on file prior to visit.    Review of Systems  Constitutional:  Negative for activity change, appetite change, fatigue, fever and unexpected weight change.  HENT:  Negative for congestion, rhinorrhea, sore throat and trouble swallowing.   Eyes:  Negative for pain, redness, itching and visual disturbance.  Respiratory:  Negative for cough, chest tightness, shortness of breath and wheezing.   Cardiovascular:  Negative for chest pain and palpitations.  Gastrointestinal:  Negative for abdominal pain, blood in stool, constipation, diarrhea and nausea.  Endocrine: Negative for cold intolerance, heat intolerance, polydipsia and polyuria.  Genitourinary:  Negative for difficulty urinating, dysuria,  frequency and urgency.  Musculoskeletal:  Negative for arthralgias, joint swelling and myalgias.  Skin:  Negative for pallor and rash.  Neurological:  Negative for dizziness, tremors, weakness, numbness and headaches.  Hematological:  Negative for adenopathy. Does not bruise/bleed easily.  Psychiatric/Behavioral:  Negative for decreased concentration and dysphoric mood. The patient is not nervous/anxious.        Objective:   Physical Exam Constitutional:      General: He is not in acute distress.    Appearance: Normal appearance. He is well-developed. He is obese. He is not ill-appearing or diaphoretic.  HENT:     Head: Normocephalic and atraumatic.     Right Ear: Tympanic membrane, ear canal and external ear normal.     Left Ear: Tympanic membrane, ear canal and external ear normal.     Nose: Nose normal. No congestion.     Mouth/Throat:     Mouth: Mucous membranes are moist.     Pharynx: Oropharynx is clear. No posterior oropharyngeal erythema.  Eyes:     General: No scleral icterus.       Right eye: No discharge.        Left eye: No discharge.     Conjunctiva/sclera: Conjunctivae normal.     Pupils: Pupils are equal, round, and reactive to light.  Neck:     Thyroid: No thyromegaly.     Vascular: No carotid bruit or JVD.  Cardiovascular:     Rate and Rhythm: Normal rate and regular rhythm.     Pulses: Normal pulses.     Heart sounds: Normal heart sounds.     No gallop.  Pulmonary:     Effort: Pulmonary effort is normal. No respiratory distress.     Breath sounds: Normal breath sounds. No wheezing or rales.     Comments: Good air exch Chest:     Chest wall: No tenderness.  Abdominal:     General: Abdomen is protuberant. Bowel sounds are normal. There is no distension or abdominal bruit.     Palpations: Abdomen is soft. There is no mass.     Tenderness: There is no abdominal tenderness.     Hernia: No hernia is present.  Musculoskeletal:        General: No tenderness.      Cervical back: Normal range of motion and neck supple. No rigidity. No muscular tenderness.     Right lower leg: No edema.     Left lower leg: No edema.  Lymphadenopathy:     Cervical: No cervical adenopathy.  Skin:    General: Skin is warm and dry.     Coloration: Skin is not pale.     Findings: No erythema or rash.     Comments: Solar skin damage on exposed areas  Primarily forearms/hands and back of neck/head   Solar lentigines diffusely Few sks   Neurological:     Mental Status: He is alert.     Cranial Nerves: No cranial nerve deficit.     Motor: No abnormal muscle tone.     Coordination: Coordination normal.     Gait: Gait normal.     Deep Tendon Reflexes: Reflexes are normal and symmetric. Reflexes normal.  Psychiatric:        Mood and Affect: Mood normal.        Cognition and Memory: Cognition and memory normal.           Assessment & Plan:   Problem List Items Addressed This Visit       Cardiovascular and Mediastinum   Essential hypertension, benign   bp in fair control at this time  BP Readings from Last 1 Encounters:  12/04/23 122/80   No changes  needed Most recent labs reviewed  Disc lifstyle change with low sodium diet and exercise  Forgot med this am but on 2nd check blood pressure was fine   Continue lisinopril 10 mg daily       Relevant Medications   atorvastatin (LIPITOR) 20 MG tablet   lisinopril (ZESTRIL) 10 MG tablet     Digestive   GERD (gastroesophageal reflux disease)   Omeprazole 20 mg daily   Encouraged to see if he can tolerate every other day  Avoid food triggers  Work on weight loss    B12 and D levels are low       Relevant Medications   omeprazole (PRILOSEC) 20 MG capsule     Other   Vitamin D deficiency   Likely due to ppi   Instructed to start 2000 international units daily for bone and overall health         Vitamin B12 deficiency   Due for ppi possibly  B12 shot today  Instructed to start 1000 mcg  daily over the counter       Routine general medical examination at a health care facility - Primary   Reviewed health habits including diet and exercise and skin cancer prevention Reviewed appropriate screening tests for age  Also reviewed health mt list, fam hx and immunization status , as well as social and family history   See HPI Labs reviewed and ordered Health Maintenance  Topic Date Due   Colon Cancer Screening  Never done   Flu Shot  12/04/2023*   Zoster (Shingles) Vaccine (1 of 2) 03/04/2024*   Hepatitis C Screening  12/03/2024*   HIV Screening  12/03/2024*   DTaP/Tdap/Td vaccine (3 - Tdap) 12/16/2029   HPV Vaccine  Aged Out   COVID-19 Vaccine  Discontinued  *Topic was postponed. The date shown is not the original due date.   Declines imms  Colonoscopy referral done  Hearing screen normal  Given letter that he is safe for FIT test at work Normal psa and no voiding symptoms Strongly encouraged to use brimmed hat (back and front) and sun protection- pt states he probably will not        Prostate cancer screening   Lab Results  Component Value Date   PSA 0.71 11/27/2023   PSA 0.98 11/01/2022   PSA 0.54 12/10/2020  No voiding symptoms No nocturia   Father had prostate cancer       Prediabetes   Lab Results  Component Value Date   HGBA1C 6.3 11/27/2023   HGBA1C 6.2 (A) 02/07/2023   HGBA1C 6.6 (H) 11/01/2022   Encouraged strongly to cut back on sugar  Replace soda with calorie free drinks and water disc imp of low glycemic diet and wt loss to prevent DM2       Morbid obesity (HCC)   Discussed how this problem influences overall health and the risks it imposes  Reviewed plan for weight loss with lower calorie diet (via better food choices (lower glycemic and portion control) along with exercise building up to or more than 30 minutes 5 days per week including some aerobic activity and strength training    Pt notes he does eat less than he used to        Hyperlipidemia   Disc goals for lipids and reasons to control them Rev last labs with pt Rev low sat fat diet in detail Well controlled with atorvastatin 20 mg daily  LDL of 70 today   Encouraged less fried  food and red meat       Relevant Medications   atorvastatin (LIPITOR) 20 MG tablet   lisinopril (ZESTRIL) 10 MG tablet   Current use of proton pump inhibitor   Omeprazole 20 mg daily  GERD is controlled  B12 and D are both low  Will supplement   Would like to wean ppi in future if possible       Colon cancer screening   Referred to GI for screening colonoscopy  Given number to call and schedule      Relevant Orders   Ambulatory referral to Gastroenterology

## 2023-12-04 NOTE — Patient Instructions (Addendum)
 You have low vitamin D  Get vitamin D3 over the counter and take 2000 international units daily  This is for bone and general health   You have low B12  We will give a B12 shot today  Get vitamin B12 over the counter and take 1000 mcg daily   The omeprazole does decrease absorption of these vitamins  Try taking it every other day and see how you do   I want to check labs for B12 in June   Gake your medicines regularly-get a days of the week pill box    Call to schedule your screening colonoscopy  [facility]  [phone]   To prevent diabetes Try to get most of your carbohydrates from produce (with the exception of white potatoes) and whole grains Eat less bread/pasta/rice/snack foods/cereals/sweets and other items from the middle of the grocery store (processed carbs)  Get rid of sugar soda Choose calorie free drinks and water

## 2023-12-04 NOTE — Assessment & Plan Note (Signed)
 bp in fair control at this time  BP Readings from Last 1 Encounters:  12/04/23 122/80   No changes needed Most recent labs reviewed  Disc lifstyle change with low sodium diet and exercise  Forgot med this am but on 2nd check blood pressure was fine   Continue lisinopril 10 mg daily

## 2023-12-04 NOTE — Assessment & Plan Note (Signed)
 Lab Results  Component Value Date   HGBA1C 6.3 11/27/2023   HGBA1C 6.2 (A) 02/07/2023   HGBA1C 6.6 (H) 11/01/2022   Encouraged strongly to cut back on sugar  Replace soda with calorie free drinks and water disc imp of low glycemic diet and wt loss to prevent DM2

## 2023-12-04 NOTE — Assessment & Plan Note (Signed)
 Lab Results  Component Value Date   PSA 0.71 11/27/2023   PSA 0.98 11/01/2022   PSA 0.54 12/10/2020  No voiding symptoms No nocturia   Father had prostate cancer

## 2023-12-04 NOTE — Assessment & Plan Note (Signed)
 Likely due to ppi   Instructed to start 2000 international units daily for bone and overall health

## 2023-12-04 NOTE — Assessment & Plan Note (Signed)
 Omeprazole 20 mg daily  GERD is controlled  B12 and D are both low  Will supplement   Would like to wean ppi in future if possible

## 2023-12-04 NOTE — Assessment & Plan Note (Signed)
 Omeprazole 20 mg daily   Encouraged to see if he can tolerate every other day  Avoid food triggers  Work on weight loss    B12 and D levels are low

## 2023-12-04 NOTE — Assessment & Plan Note (Signed)
 Discussed how this problem influences overall health and the risks it imposes  Reviewed plan for weight loss with lower calorie diet (via better food choices (lower glycemic and portion control) along with exercise building up to or more than 30 minutes 5 days per week including some aerobic activity and strength training    Pt notes he does eat less than he used to

## 2023-12-04 NOTE — Assessment & Plan Note (Signed)
 Due for ppi possibly  B12 shot today  Instructed to start 1000 mcg daily over the counter

## 2023-12-04 NOTE — Assessment & Plan Note (Addendum)
 Reviewed health habits including diet and exercise and skin cancer prevention Reviewed appropriate screening tests for age  Also reviewed health mt list, fam hx and immunization status , as well as social and family history   See HPI Labs reviewed and ordered Health Maintenance  Topic Date Due   Colon Cancer Screening  Never done   Flu Shot  12/04/2023*   Zoster (Shingles) Vaccine (1 of 2) 03/04/2024*   Hepatitis C Screening  12/03/2024*   HIV Screening  12/03/2024*   DTaP/Tdap/Td vaccine (3 - Tdap) 12/16/2029   HPV Vaccine  Aged Out   COVID-19 Vaccine  Discontinued  *Topic was postponed. The date shown is not the original due date.   Declines imms  Colonoscopy referral done  Hearing screen normal  Given letter that he is safe for FIT test at work Normal psa and no voiding symptoms Strongly encouraged to use brimmed hat (back and front) and sun protection- pt states he probably will not

## 2023-12-04 NOTE — Assessment & Plan Note (Signed)
 Disc goals for lipids and reasons to control them Rev last labs with pt Rev low sat fat diet in detail Well controlled with atorvastatin 20 mg daily  LDL of 70 today   Encouraged less fried food and red meat

## 2023-12-04 NOTE — Assessment & Plan Note (Signed)
 Referred to GI for screening colonoscopy  Given number to call and schedule

## 2024-01-22 ENCOUNTER — Encounter: Payer: Self-pay | Admitting: Family Medicine

## 2024-02-18 ENCOUNTER — Telehealth: Payer: Self-pay | Admitting: Family Medicine

## 2024-02-18 DIAGNOSIS — R7303 Prediabetes: Secondary | ICD-10-CM

## 2024-02-18 DIAGNOSIS — E538 Deficiency of other specified B group vitamins: Secondary | ICD-10-CM

## 2024-02-18 NOTE — Telephone Encounter (Signed)
-----   Message from Gerry Krone sent at 02/12/2024  9:22 AM EDT ----- Regarding: Lab orders for [facility], 6.19.25 Lab orders, for B12? thanks

## 2024-02-22 ENCOUNTER — Other Ambulatory Visit (INDEPENDENT_AMBULATORY_CARE_PROVIDER_SITE_OTHER)

## 2024-02-22 ENCOUNTER — Ambulatory Visit: Payer: Self-pay | Admitting: Family Medicine

## 2024-02-22 DIAGNOSIS — E538 Deficiency of other specified B group vitamins: Secondary | ICD-10-CM

## 2024-02-22 LAB — VITAMIN B12: Vitamin B-12: 432 pg/mL (ref 211–911)

## 2024-02-29 IMAGING — DX DG HIP (WITH OR WITHOUT PELVIS) 2-3V*R*
3 series · 3 of 3 positions shown · non-contrast
Comparison: None.

CLINICAL DATA: Osteoarthritis. Bilateral hip pain for several
months.

EXAM:
DG HIP (WITH OR WITHOUT PELVIS) 2-3V RIGHT; DG HIP (WITH OR WITHOUT
PELVIS) 2-3V LEFT

[pelvis ap (1 of 2)]
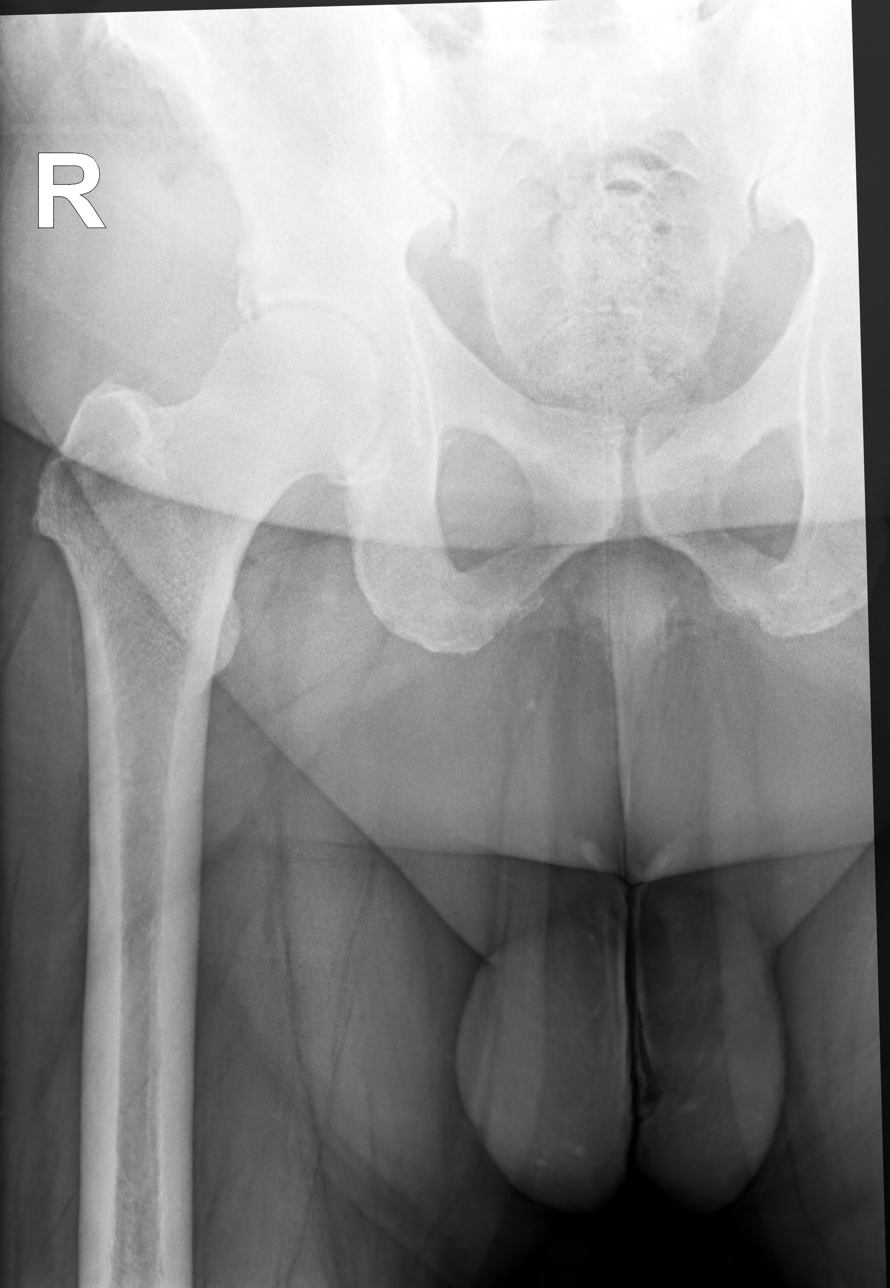

[pelvis ap (2 of 2)]
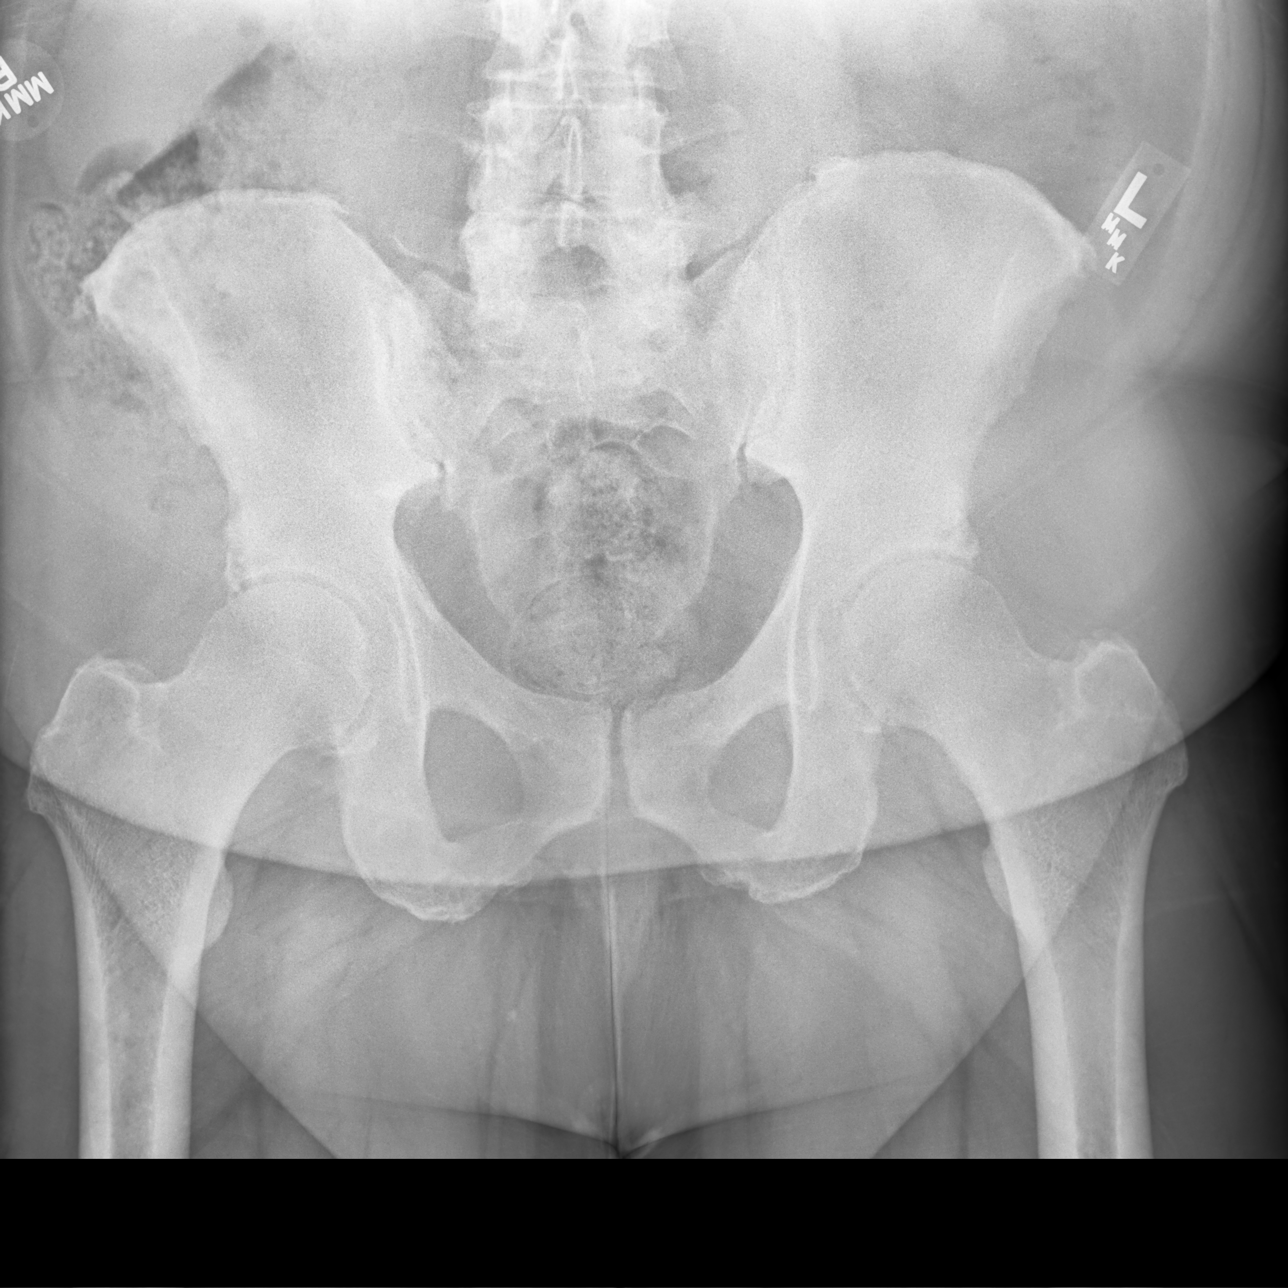

[hip (frog leg)]
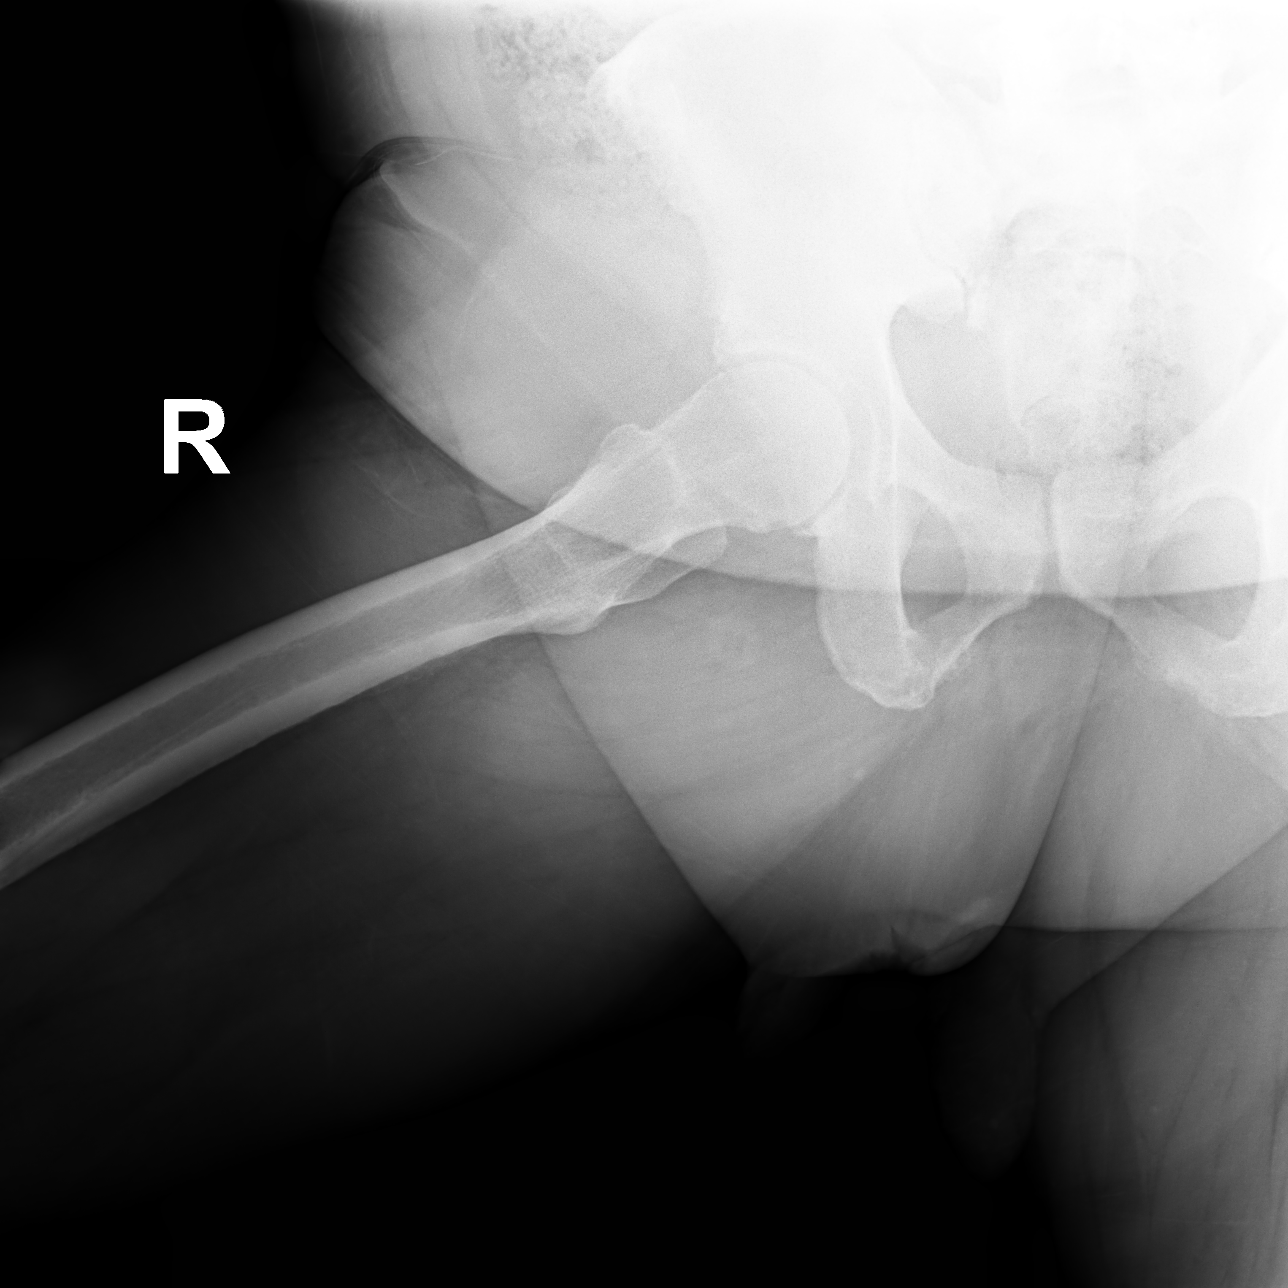

[3 of 3 positions shown; findings below may reference images not displayed]

FINDINGS: Moderate bilateral femoroacetabular joint space narrowing. Mild
bilateral femoroacetabular subchondral sclerosis.

Mild bilateral sacroiliac subchondral sclerosis degenerative change.
The pubic symphysis joint space is maintained.

No acute fracture or dislocation.
IMPRESSION: Mild bilateral femoroacetabular and mild bilateral sacroiliac joint
osteoarthritis.

## 2024-02-29 IMAGING — DX DG HIP (WITH OR WITHOUT PELVIS) 2-3V*L*
2 series · 2 of 2 positions shown · non-contrast
Comparison: None.

CLINICAL DATA: Osteoarthritis. Bilateral hip pain for several
months.

EXAM:
DG HIP (WITH OR WITHOUT PELVIS) 2-3V RIGHT; DG HIP (WITH OR WITHOUT
PELVIS) 2-3V LEFT

[hip ap]
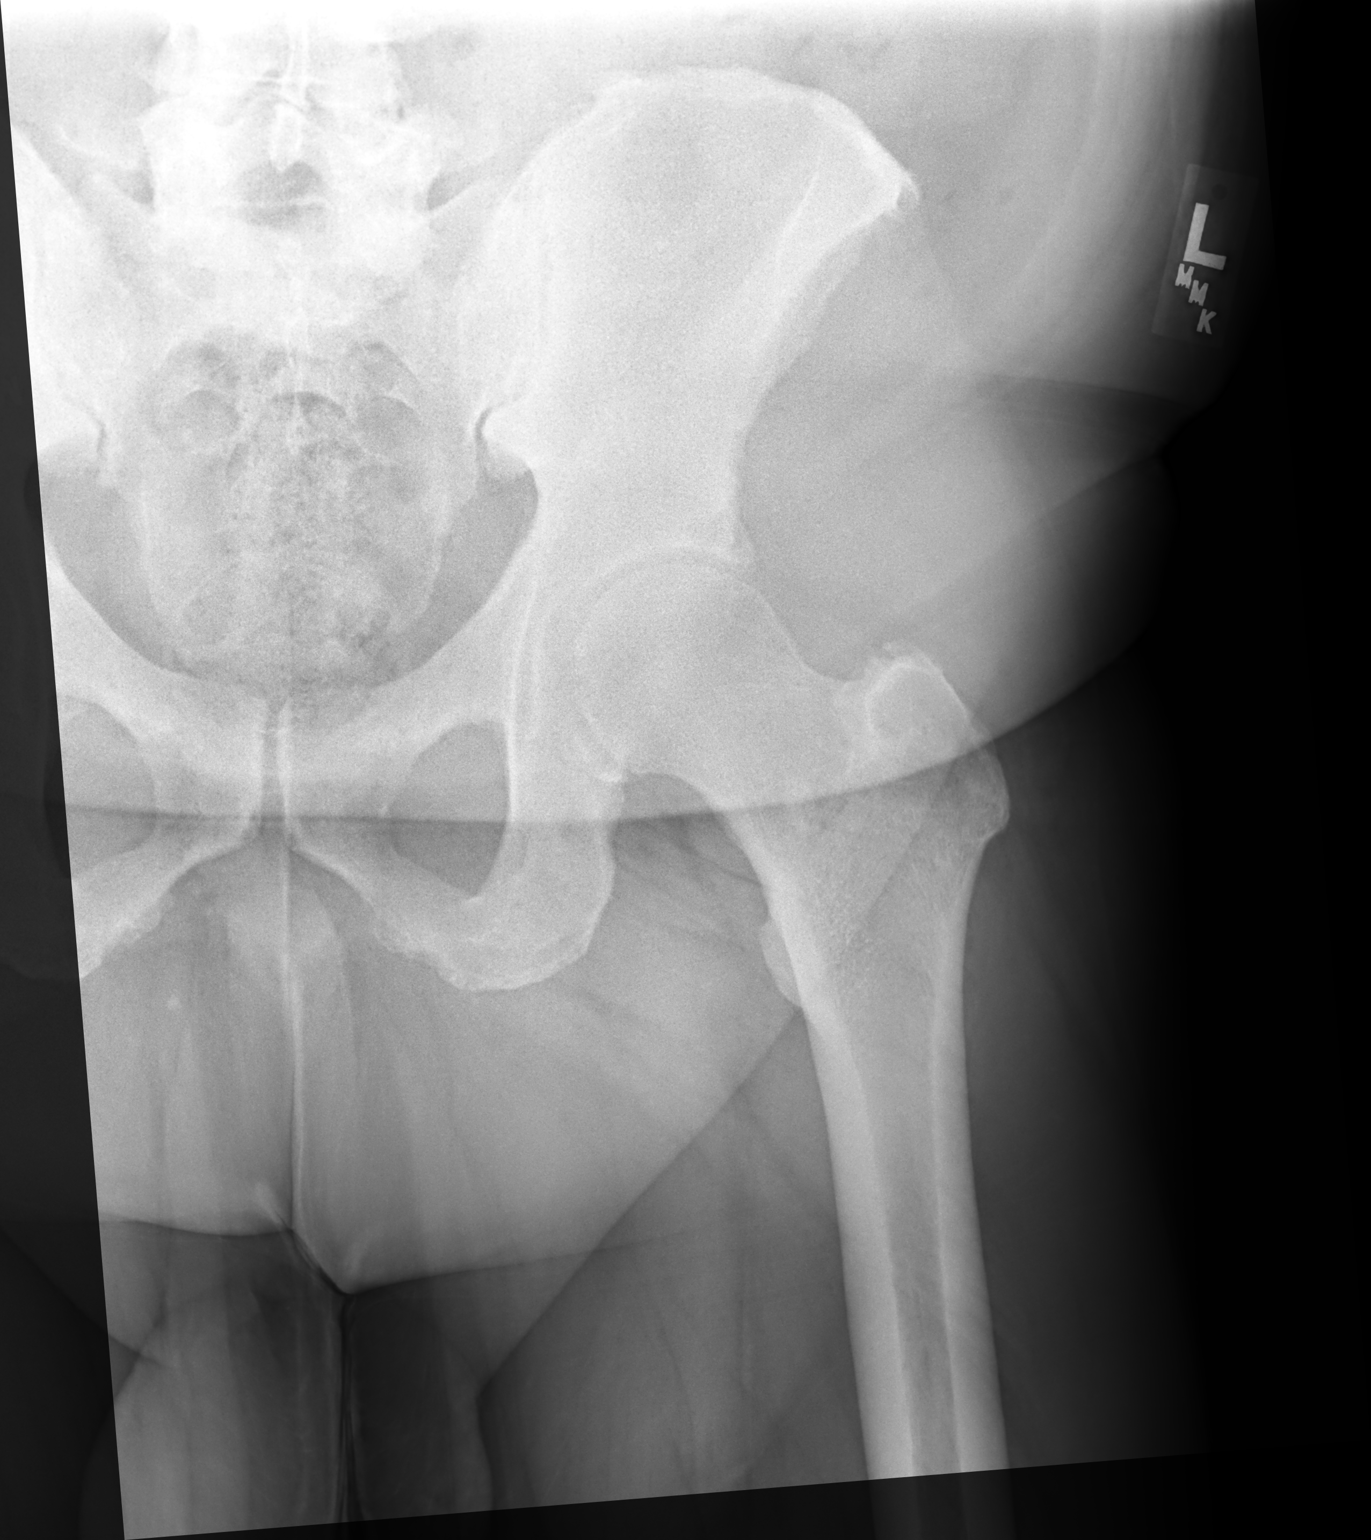

[hip (frog leg)]
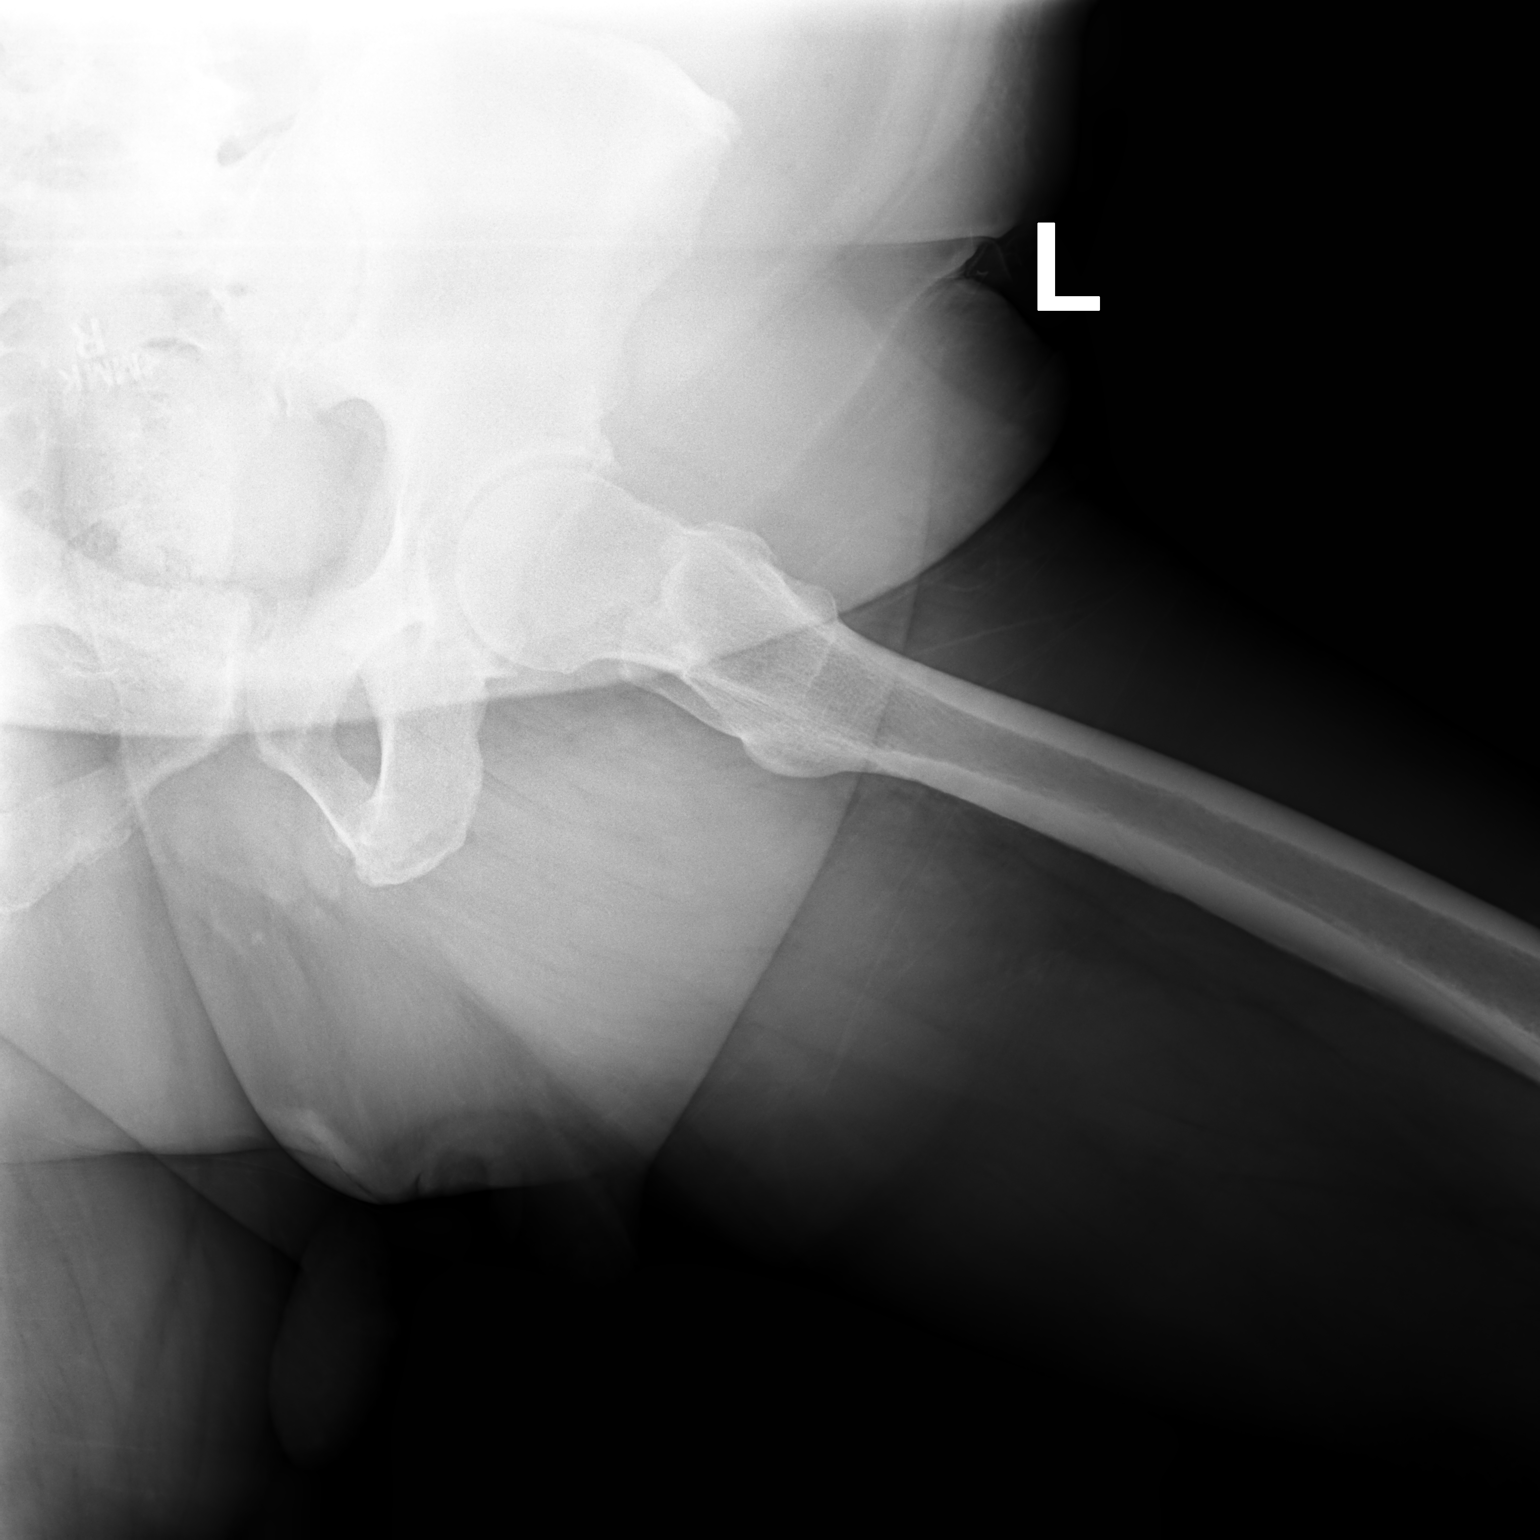

[2 of 2 positions shown; findings below may reference images not displayed]

FINDINGS: Moderate bilateral femoroacetabular joint space narrowing. Mild
bilateral femoroacetabular subchondral sclerosis.

Mild bilateral sacroiliac subchondral sclerosis degenerative change.
The pubic symphysis joint space is maintained.

No acute fracture or dislocation.
IMPRESSION: Mild bilateral femoroacetabular and mild bilateral sacroiliac joint
osteoarthritis.
